# Patient Record
Sex: Female | Born: 1980 | Race: White | Hispanic: No | Marital: Married | Smoking: Never smoker
Health system: Southern US, Community
[De-identification: ages and names within clinical notes are randomized; demographics above are authoritative.]

## PROBLEM LIST (undated history)

## (undated) DIAGNOSIS — F419 Anxiety disorder, unspecified: Secondary | ICD-10-CM

## (undated) DIAGNOSIS — F319 Bipolar disorder, unspecified: Secondary | ICD-10-CM

## (undated) DIAGNOSIS — N289 Disorder of kidney and ureter, unspecified: Secondary | ICD-10-CM

## (undated) DIAGNOSIS — R112 Nausea with vomiting, unspecified: Secondary | ICD-10-CM

## (undated) DIAGNOSIS — Q625 Duplication of ureter: Secondary | ICD-10-CM

## (undated) DIAGNOSIS — F32A Depression, unspecified: Secondary | ICD-10-CM

## (undated) DIAGNOSIS — N390 Urinary tract infection, site not specified: Secondary | ICD-10-CM

## (undated) DIAGNOSIS — F329 Major depressive disorder, single episode, unspecified: Secondary | ICD-10-CM

## (undated) DIAGNOSIS — T8859XA Other complications of anesthesia, initial encounter: Secondary | ICD-10-CM

## (undated) DIAGNOSIS — T4145XA Adverse effect of unspecified anesthetic, initial encounter: Secondary | ICD-10-CM

## (undated) DIAGNOSIS — Z9889 Other specified postprocedural states: Secondary | ICD-10-CM

## (undated) HISTORY — PX: URETER REMOVAL: SHX821

## (undated) HISTORY — PX: APPENDECTOMY: SHX54

## (undated) HISTORY — DX: Duplication of ureter: Q62.5

## (undated) HISTORY — PX: HERNIA REPAIR: SHX51

---

## 2010-05-22 HISTORY — PX: TUBAL LIGATION: SHX77

## 2012-10-20 ENCOUNTER — Emergency Department (HOSPITAL_COMMUNITY)
Admission: EM | Admit: 2012-10-20 | Discharge: 2012-10-20 | Disposition: A | Discharge status: Home / Self Care | Payer: Medicaid Other | Attending: Emergency Medicine | Admitting: Emergency Medicine

## 2012-10-20 ENCOUNTER — Encounter (HOSPITAL_COMMUNITY): Payer: Self-pay | Admitting: Emergency Medicine

## 2012-10-20 DIAGNOSIS — H6691 Otitis media, unspecified, right ear: Secondary | ICD-10-CM

## 2012-10-20 DIAGNOSIS — Z87448 Personal history of other diseases of urinary system: Secondary | ICD-10-CM | POA: Insufficient documentation

## 2012-10-20 DIAGNOSIS — Z91040 Latex allergy status: Secondary | ICD-10-CM | POA: Insufficient documentation

## 2012-10-20 DIAGNOSIS — H6690 Otitis media, unspecified, unspecified ear: Secondary | ICD-10-CM | POA: Insufficient documentation

## 2012-10-20 DIAGNOSIS — Z87440 Personal history of urinary (tract) infections: Secondary | ICD-10-CM | POA: Insufficient documentation

## 2012-10-20 DIAGNOSIS — J3489 Other specified disorders of nose and nasal sinuses: Secondary | ICD-10-CM | POA: Insufficient documentation

## 2012-10-20 DIAGNOSIS — H9209 Otalgia, unspecified ear: Secondary | ICD-10-CM | POA: Insufficient documentation

## 2012-10-20 DIAGNOSIS — H6120 Impacted cerumen, unspecified ear: Secondary | ICD-10-CM | POA: Insufficient documentation

## 2012-10-20 DIAGNOSIS — J029 Acute pharyngitis, unspecified: Secondary | ICD-10-CM

## 2012-10-20 HISTORY — DX: Disorder of kidney and ureter, unspecified: N28.9

## 2012-10-20 HISTORY — DX: Urinary tract infection, site not specified: N39.0

## 2012-10-20 MED ORDER — IBUPROFEN 600 MG PO TABS: 600.0000 mg | ORAL_TABLET | Freq: Four times a day (QID) | ORAL | Status: DC | PRN

## 2012-10-20 MED ORDER — NEOMYCIN-POLYMYXIN-HC 3.5-10000-1 OT SOLN
4.0000 [drp] | Freq: Once | OTIC | Status: AC
  Administered 2012-10-20: 4 [drp] via OTIC
  Filled 2012-10-20: qty 10

## 2012-10-20 MED ORDER — AMOXICILLIN 250 MG PO CAPS: 250.0000 mg | ORAL_CAPSULE | Freq: Three times a day (TID) | ORAL | Status: DC

## 2012-10-20 NOTE — ED Provider Notes (Signed)
History     CSN: [id number]  Arrival date & time 10/20/12  1708   First MD Initiated Contact with Patient 10/20/12 1718      Chief Complaint  Patient presents with  . Otalgia  . Sore Throat    (Consider location/radiation/quality/duration/timing/severity/associated sxs/prior treatment) Patient is a 32 y.o. female presenting with ear pain and pharyngitis. The history is provided by the patient.  Otalgia Location:  Right Severity:  Moderate Onset quality:  Gradual Duration:  1 day Timing:  Constant Progression:  Worsening Chronicity:  New Relieved by:  Nothing Associated symptoms: congestion and sore throat   Associated symptoms: no abdominal pain, no diarrhea, no fever, no headaches, no rash and no vomiting   Sore Throat Associated symptoms include congestion and a sore throat. Pertinent negatives include no abdominal pain, chills, fever, headaches, nausea, rash or vomiting.   Diane [name] is a 32 y.o. female who presents to the ED with ear pain x 24 hours. The pain is in the right ear. Associated symptoms include sore throat, congestion and ears feeling stopped up as well as the pain in the right.   Past Medical History  Diagnosis Date  . Renal disorder   . UTI (lower urinary tract infection)     No past surgical history on file.  No family history on file.  History  Substance Use Topics  . Smoking status: Not on file  . Smokeless tobacco: Not on file  . Alcohol Use: Not on file    OB History   Grav Para Term Preterm Abortions TAB SAB Ect Mult Living                  Review of Systems  Constitutional: Negative for fever, chills and appetite change.  HENT: Positive for ear pain, congestion and sore throat. Negative for trouble swallowing.   Gastrointestinal: Negative for nausea, vomiting, abdominal pain, diarrhea and constipation.  Genitourinary: Negative for dysuria and frequency.  Musculoskeletal: Negative for back pain.  Skin: Negative for rash.   Neurological: Negative for light-headedness and headaches.  Psychiatric/Behavioral: The patient is not nervous/anxious.     Allergies  Latex  Home Medications  No current outpatient prescriptions on file.  BP 115/90  Pulse 72  Temp(Src) 98 F (36.7 C)  Resp 16  Ht 5\' 3"  (1.6 m)  Wt 145 lb (65.772 kg)  BMI 25.69 kg/m2  SpO2 100%  LMP 09/19/2012  Physical Exam  Nursing note and vitals reviewed. Constitutional: She is oriented to person, place, and time. She appears well-developed and well-nourished. No distress.  HENT:  Head: Normocephalic and atraumatic.  Mouth/Throat: Uvula is midline and mucous membranes are normal. Posterior oropharyngeal erythema present.  Both ears occluded with cerumen. After irrigation the TM's can be visualized. The left is normal. The right exhibits erythema, the TM is retracted and erythematous.   Eyes: Conjunctivae and EOM are normal.  Neck: Normal range of motion. Neck supple.  Cardiovascular: Normal rate and regular rhythm.   Pulmonary/Chest: Effort normal and breath sounds normal.  Abdominal: Soft. There is no tenderness. There is no CVA tenderness.  Musculoskeletal: Normal range of motion.  Neurological: She is alert and oriented to person, place, and time. No cranial nerve deficit.  Skin: Skin is warm and dry.  Psychiatric: She has a normal mood and affect. Her behavior is normal. Judgment and thought content normal.   ED Course  Procedures (including critical care time) I irrigated the patient's ears with warm water using a syringe,  good results with cerumen removed.  Cortisporin Otic. Susp. To ears.   MDM  32 y.o. female with bilateral cerumen impaction. After cerumen removed, TM's visualized, otitis media right, pharyngitis. Discussed with the patient clinical findings and plan of care. All questioned fully answered. She will return if she has problems. She recently moved to [place] from [place]. I gave her resource guide to try and  find PCP. Will start patient on Amoxil and ibuprofen. She will contact a PCP for follow up.    Medication List    TAKE these medications       amoxicillin 250 MG capsule  Commonly known as:  AMOXIL  Take 1 capsule (250 mg total) by mouth 3 (three) times daily.     ibuprofen 600 MG tablet  Commonly known as:  ADVIL,MOTRIN  Take 1 tablet (600 mg total) by mouth every 6 (six) hours as needed for pain.             [facility] Orlene Och, [place] 10/20/12 1753

## 2012-10-20 NOTE — ED Notes (Signed)
States that she woke up with right ear pain and sore throat yesterday.

## 2012-10-21 NOTE — ED Provider Notes (Signed)
Medical screening examination/treatment/procedure(s) were performed by non-physician practitioner and as supervising physician I was immediately available for consultation/collaboration. Larin Weissberg, MD, FACEP   Tyasia Packard L Prerana Strayer, MD 10/21/12 0029 

## 2013-08-04 ENCOUNTER — Encounter (HOSPITAL_COMMUNITY): Payer: Self-pay | Admitting: Emergency Medicine

## 2013-08-04 ENCOUNTER — Emergency Department (HOSPITAL_COMMUNITY)
Admission: EM | Admit: 2013-08-04 | Discharge: 2013-08-04 | Disposition: A | Discharge status: Home / Self Care | Payer: Medicaid Other | Attending: Emergency Medicine | Admitting: Emergency Medicine

## 2013-08-04 DIAGNOSIS — Y939 Activity, unspecified: Secondary | ICD-10-CM | POA: Insufficient documentation

## 2013-08-04 DIAGNOSIS — Z79899 Other long term (current) drug therapy: Secondary | ICD-10-CM | POA: Insufficient documentation

## 2013-08-04 DIAGNOSIS — Z91040 Latex allergy status: Secondary | ICD-10-CM | POA: Insufficient documentation

## 2013-08-04 DIAGNOSIS — S335XXA Sprain of ligaments of lumbar spine, initial encounter: Secondary | ICD-10-CM | POA: Insufficient documentation

## 2013-08-04 DIAGNOSIS — R52 Pain, unspecified: Secondary | ICD-10-CM | POA: Insufficient documentation

## 2013-08-04 DIAGNOSIS — X58XXXA Exposure to other specified factors, initial encounter: Secondary | ICD-10-CM | POA: Insufficient documentation

## 2013-08-04 DIAGNOSIS — Y929 Unspecified place or not applicable: Secondary | ICD-10-CM | POA: Insufficient documentation

## 2013-08-04 DIAGNOSIS — G8929 Other chronic pain: Secondary | ICD-10-CM | POA: Insufficient documentation

## 2013-08-04 DIAGNOSIS — T148XXA Other injury of unspecified body region, initial encounter: Secondary | ICD-10-CM

## 2013-08-04 DIAGNOSIS — Z87440 Personal history of urinary (tract) infections: Secondary | ICD-10-CM | POA: Insufficient documentation

## 2013-08-04 DIAGNOSIS — Z87448 Personal history of other diseases of urinary system: Secondary | ICD-10-CM | POA: Insufficient documentation

## 2013-08-04 MED ORDER — METHOCARBAMOL 500 MG PO TABS: 500.0000 mg | ORAL_TABLET | Freq: Three times a day (TID) | ORAL | Status: DC

## 2013-08-04 MED ORDER — DEXAMETHASONE 4 MG PO TABS: ORAL_TABLET | ORAL | Status: DC

## 2013-08-04 NOTE — ED Provider Notes (Signed)
CSN: [id number]     Arrival date & time 08/04/13  1247 History   First MD Initiated Contact with Patient 08/04/13 1405     Chief Complaint  Patient presents with  . Back Pain     (Consider location/radiation/quality/duration/timing/severity/associated sxs/prior Treatment) Patient is a 33 y.o. female presenting with back pain.  Back Pain Location:  Lumbar spine Quality:  Aching Pain severity:  Moderate Pain is:  Same all the time Onset quality:  Gradual Duration:  6 months Timing:  Intermittent Progression:  Worsening Chronicity:  Chronic Context: twisting   Context: not falling   Relieved by:  Nothing Worsened by:  Movement Ineffective treatments:  None tried Associated symptoms: no abdominal pain, no bladder incontinence, no bowel incontinence, no chest pain, no dysuria, no numbness and no perianal numbness     Past Medical History  Diagnosis Date  . Renal disorder   . UTI (lower urinary tract infection)    Past Surgical History  Procedure Laterality Date  . Tubal ligation  2012  . Ureter removal     Family History  Problem Relation Age of Onset  . Diabetes Mother   . Stroke Father   . Heart failure Father   . Diabetes Father   . Epilepsy Brother    History  Substance Use Topics  . Smoking status: Never Smoker   . Smokeless tobacco: Never Used  . Alcohol Use: No   OB History   Grav Para Term Preterm Abortions TAB SAB Ect Mult Living   6 6 1 5      6      Review of Systems  Constitutional: Negative for activity change.       All ROS Neg except as noted in HPI  HENT: Negative for nosebleeds.   Eyes: Negative for photophobia and discharge.  Respiratory: Negative for cough, shortness of breath and wheezing.   Cardiovascular: Negative for chest pain and palpitations.  Gastrointestinal: Negative for abdominal pain, blood in stool and bowel incontinence.  Genitourinary: Negative for bladder incontinence, dysuria, frequency and hematuria.  Musculoskeletal:  Positive for back pain. Negative for arthralgias and neck pain.  Skin: Negative.   Neurological: Negative for dizziness, seizures, speech difficulty and numbness.  Psychiatric/Behavioral: Negative for hallucinations and confusion.      Allergies  Latex  Home Medications   Current Outpatient Rx  Name  Route  Sig  Dispense  Refill  . ARIPiprazole (ABILIFY) 5 MG tablet   Oral   Take 5 mg by mouth 2 (two) times daily.         Marland Kitchen FLUoxetine (PROZAC) 20 MG tablet   Oral   Take 20 mg by mouth 3 (three) times daily.         . methocarbamol (ROBAXIN) 500 MG tablet   Oral   Take 500 mg by mouth daily as needed (pain).          BP 130/78  Pulse 79  Temp(Src) 97.9 F (36.6 C) (Oral)  Resp 18  Ht 5\' 3"  (1.6 m)  Wt 145 lb (65.772 kg)  BMI 25.69 kg/m2  SpO2 100%  LMP 07/14/2013 Physical Exam  Nursing note and vitals reviewed. Constitutional: She is oriented to person, place, and time. She appears well-developed and well-nourished.  Non-toxic appearance.  HENT:  Head: Normocephalic.  Right Ear: Tympanic membrane and external ear normal.  Left Ear: Tympanic membrane and external ear normal.  Eyes: EOM and lids are normal. Pupils are equal, round, and reactive to light.  Neck:  Normal range of motion. Neck supple. Carotid bruit is not present.  Cardiovascular: Normal rate, regular rhythm, normal heart sounds, intact distal pulses and normal pulses.   Pulmonary/Chest: Breath sounds normal. No respiratory distress.  Abdominal: Soft. Bowel sounds are normal. There is no tenderness. There is no guarding.  Musculoskeletal: Normal range of motion.       Thoracic back: She exhibits pain and spasm.       Lumbar back: She exhibits pain and spasm.       Back:  Lymphadenopathy:       Head (right side): No submandibular adenopathy present.       Head (left side): No submandibular adenopathy present.    She has no cervical adenopathy.  Neurological: She is alert and oriented to  person, place, and time. She has normal strength. No cranial nerve deficit or sensory deficit. Gait normal.  Skin: Skin is warm and dry.  Psychiatric: She has a normal mood and affect. Her speech is normal.    ED Course  Procedures (including critical care time) Labs Review Labs Reviewed - No data to display Imaging Review No results found.   EKG Interpretation None      MDM Pt has an acute exacerbation of chronic back pain and muscle strain. No gross neuro deficits. No step of noted. No fever or dysuria reported. Plan - Rx for decadron and robaxin. Pt to rest back as much as possible. Pt to follow up with orthopedics.   Final diagnoses:  Muscle strain    **I have reviewed nursing notes, vital signs, and all appropriate lab and imaging results for this patient.Kathie Dike*    Chanice Brenton M Adamariz Gillott, PA-C 08/06/13 1128

## 2013-08-04 NOTE — ED Notes (Signed)
Pain  c spine and t spine area for months, Worse in last 3 weeks. No known injury.  Has been taking robaxin and naprosyn without relief.

## 2013-08-04 NOTE — Discharge Instructions (Signed)
Please apply heat to your back. Please use Decadron 2 times daily with food. Please use Robaxin 3 times daily. Please use Tylenol extra strength every 4 hours. Robaxin may cause drowsiness, please use with caution. See your physician and being a dull pediatric medicine clinic for followup and management. Muscle Strain A muscle strain (pulled muscle) happens when a muscle is stretched beyond normal length. It happens when a sudden, violent force stretches your muscle too far. Usually, a few of the fibers in your muscle are torn. Muscle strain is common in athletes. Recovery usually takes 1 2 weeks. Complete healing takes 5 6 weeks.  HOME CARE   Follow the PRICE method of treatment to help your injury get better. Do this the first 2 3 days after the injury:  Protect. Protect the muscle to keep it from getting injured again.  Rest. Limit your activity and rest the injured body part.  Ice. Put ice in a plastic bag. Place a towel between your skin and the bag. Then, apply the ice and leave it on from 15 20 minutes each hour. After the third day, switch to moist heat packs.  Compression. Use a splint or elastic bandage on the injured area for comfort. Do not put it on too tightly.  Elevate. Keep the injured body part above the level of your heart.  Only take medicine as told by your doctor.  Warm up before doing exercise to prevent future muscle strains. GET HELP IF:   You have more pain or puffiness (swelling) in the injured area.  You feel numbness, tingling, or notice a loss of strength in the injured area. MAKE SURE YOU:   Understand these instructions.  Will watch your condition.  Will get help right away if you are not doing well or get worse. Document Released: 02/15/2008 Document Revised: 02/26/2013 Document Reviewed: 12/05/2012 [facility] Patient Information 2014 [employer] [place].

## 2013-08-04 NOTE — ED Notes (Signed)
Pt reports intermittent back pain x 6 months.

## 2013-08-07 NOTE — ED Provider Notes (Signed)
Medical screening examination/treatment/procedure(s) were performed by non-physician practitioner and as supervising physician I was immediately available for consultation/collaboration.   EKG Interpretation None        Joya Gaskinsonald W Breslin Burklow, MD 08/07/13 1511

## 2013-09-09 ENCOUNTER — Ambulatory Visit (HOSPITAL_COMMUNITY)
Admission: RE | Admit: 2013-09-09 | Discharge: 2013-09-09 | Disposition: A | Discharge status: Home / Self Care | Payer: Medicaid Other | Source: Ambulatory Visit | Attending: Family Medicine | Admitting: Family Medicine

## 2013-09-09 DIAGNOSIS — R293 Abnormal posture: Secondary | ICD-10-CM | POA: Insufficient documentation

## 2013-09-09 NOTE — Evaluation (Signed)
Physical Therapy Evaluation  Patient Details  Name: Diane [name] MRN: [id number] Date of Birth: 1980/07/11  Today's Date: 09/09/2013 Time: 1515-1600 PT Time Calculation (min): 45 min Charge:  Evaluation              Visit#: 1 of 1  Assessment Diagnosis: thoracic back pain Prior Therapy: none Authorization: medicaid     Past Medical History:  Past Medical History  Diagnosis Date  . Renal disorder   . UTI (lower urinary tract infection)    Past Surgical History:  Past Surgical History  Procedure Laterality Date  . Tubal ligation  2012  . 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  She has been having low back pain for approximately three months.  [facility].  Pt states her pain is in the center of her shoulder blades.   How long can you sit comfortably?: sometimes it is aggrevated other times it isn't. How long can you stand comfortably?: no real change;   Able to stand for up to an hour  How long can you walk comfortably?: able to walk as long as she wants to  but sometimes it increases her pain. Pain Assessment Currently in Pain?: Yes Pain Score: 5  Pain Location: Back Pain Orientation: Mid Pain Type: Chronic pain    Balance Screening  no falls   Prior Functioning   I works at [employer]    Assessment Palpation Palpation: T 7 and 8 were slightly rotatedto the LT ; no mm spasms palpatable.  Exercise/Treatments    Stretches Chest Stretch: 3 reps Lower Cervical/Upper Thoracic Stretch: 3 reps   Theraband Exercises Scapula Retraction: 10 reps;Blue Shoulder Extension: 10 reps;Blue Rows: 10 reps;Blue  Supine Exercises Other Supine Exercise: pelvic tilt x 10    Prone Exercises w-back x 10 Shoulder extension x 10 Single arm raise x 10 Rows x 10  Quadriped position:  Mad cat x 5      Physical Therapy Assessment and Plan PT  Assessment and Plan Clinical Impression Statement: Pt is a 33 yo female who has been having progressive thoracic pain over the past three months. Pt exam demonstrated postural imbalances , decreased scapular stabilzation.  Pt will benefit form instruction on strengthening as well as body mechanics.  Pt insurance only allows one evaluation per year.   Pt will benefit from skilled therapeutic intervention in order to improve on the following deficits: Decreased activity tolerance;Decreased strength;Pain;Improper spinal/pelvic alignment Rehab Potential: Fair PT Frequency: Min 1X/week PT Duration:  (1 time only secondary to insurance.) HEP: given for shoulder stability, posture as well as proper body mechanics.       Goals Home Exercise Program Pt/caregiver will Perform Home Exercise Program: For increased strengthening PT Goal: Perform Home Exercise Program - Progress: Goal set today Plan:  One time visit for HEP  Problem List Patient Active Problem List   Diagnosis Date Noted  . Poor posture 09/09/2013       GP    Bella Kennedyynthia J Leelan Rajewski 09/09/2013, 4:36 PM  Physician Documentation Your signature is required to indicate approval of the treatment plan as stated above.  Please sign and either send electronically or make a copy of this report for your files and return this physician signed original.   Please mark one 1.__approve of plan  2. ___approve of plan with the following conditions.   ______________________________  _____________________ Physician Signature                                                                                                             Date

## 2014-02-01 ENCOUNTER — Encounter (HOSPITAL_COMMUNITY): Payer: Self-pay | Admitting: Emergency Medicine

## 2014-02-01 ENCOUNTER — Emergency Department (HOSPITAL_COMMUNITY)
Admission: EM | Admit: 2014-02-01 | Discharge: 2014-02-01 | Disposition: A | Discharge status: Home / Self Care | Payer: Medicaid Other | Attending: Emergency Medicine | Admitting: Emergency Medicine

## 2014-02-01 DIAGNOSIS — R42 Dizziness and giddiness: Secondary | ICD-10-CM | POA: Insufficient documentation

## 2014-02-01 DIAGNOSIS — Z3202 Encounter for pregnancy test, result negative: Secondary | ICD-10-CM | POA: Diagnosis not present

## 2014-02-01 DIAGNOSIS — Z79899 Other long term (current) drug therapy: Secondary | ICD-10-CM | POA: Insufficient documentation

## 2014-02-01 DIAGNOSIS — Z9089 Acquired absence of other organs: Secondary | ICD-10-CM | POA: Insufficient documentation

## 2014-02-01 DIAGNOSIS — Z87448 Personal history of other diseases of urinary system: Secondary | ICD-10-CM | POA: Insufficient documentation

## 2014-02-01 DIAGNOSIS — Z9851 Tubal ligation status: Secondary | ICD-10-CM | POA: Insufficient documentation

## 2014-02-01 DIAGNOSIS — Z91040 Latex allergy status: Secondary | ICD-10-CM | POA: Diagnosis not present

## 2014-02-01 DIAGNOSIS — IMO0002 Reserved for concepts with insufficient information to code with codable children: Secondary | ICD-10-CM | POA: Diagnosis not present

## 2014-02-01 DIAGNOSIS — N390 Urinary tract infection, site not specified: Secondary | ICD-10-CM | POA: Insufficient documentation

## 2014-02-01 LAB — BASIC METABOLIC PANEL
Anion gap: 12 (ref 5–15)
BUN: 10 mg/dL (ref 6–23)
CALCIUM: 9.2 mg/dL (ref 8.4–10.5)
CO2: 26 mEq/L (ref 19–32)
Chloride: 100 mEq/L (ref 96–112)
Creatinine, Ser: 1.02 mg/dL (ref 0.50–1.10)
GFR, EST AFRICAN AMERICAN: 83 mL/min — AB (ref >90)
GFR, EST NON AFRICAN AMERICAN: 72 mL/min — AB (ref >90)
GLUCOSE: 123 mg/dL — AB (ref 70–99)
Potassium: 3.6 mEq/L — ABNORMAL LOW (ref 3.7–5.3)
SODIUM: 138 meq/L (ref 137–147)

## 2014-02-01 LAB — URINALYSIS, ROUTINE W REFLEX MICROSCOPIC
BILIRUBIN URINE: NEGATIVE
Glucose, UA: NEGATIVE mg/dL
KETONES UR: NEGATIVE mg/dL
NITRITE: NEGATIVE
PROTEIN: NEGATIVE mg/dL
SPECIFIC GRAVITY, URINE: 1.01 (ref 1.005–1.030)
UROBILINOGEN UA: 0.2 mg/dL (ref 0.0–1.0)
pH: 6 (ref 5.0–8.0)

## 2014-02-01 LAB — CBC WITH DIFFERENTIAL/PLATELET
BASOS PCT: 0 % (ref 0–1)
Basophils Absolute: 0 10*3/uL (ref 0.0–0.1)
EOS ABS: 0.5 10*3/uL (ref 0.0–0.7)
EOS PCT: 7 % — AB (ref 0–5)
HCT: 39.5 % (ref 36.0–46.0)
Hemoglobin: 13.5 g/dL (ref 12.0–15.0)
LYMPHS ABS: 1.2 10*3/uL (ref 0.7–4.0)
Lymphocytes Relative: 18 % (ref 12–46)
MCH: 29.5 pg (ref 26.0–34.0)
MCHC: 34.2 g/dL (ref 30.0–36.0)
MCV: 86.2 fL (ref 78.0–100.0)
MONOS PCT: 4 % (ref 3–12)
Monocytes Absolute: 0.3 10*3/uL (ref 0.1–1.0)
NEUTROS PCT: 71 % (ref 43–77)
Neutro Abs: 4.8 10*3/uL (ref 1.7–7.7)
PLATELETS: 228 10*3/uL (ref 150–400)
RBC: 4.58 MIL/uL (ref 3.87–5.11)
RDW: 12.9 % (ref 11.5–15.5)
WBC: 6.8 10*3/uL (ref 4.0–10.5)

## 2014-02-01 LAB — URINE MICROSCOPIC-ADD ON

## 2014-02-01 LAB — PREGNANCY, URINE: PREG TEST UR: NEGATIVE

## 2014-02-01 MED ORDER — CEPHALEXIN 500 MG PO CAPS
500.0000 mg | ORAL_CAPSULE | Freq: Four times a day (QID) | ORAL | Status: DC
Start: 2014-02-01 — End: 2015-05-29

## 2014-02-01 MED ORDER — LIDOCAINE HCL (PF) 2 % IJ SOLN
INTRAMUSCULAR | Status: AC
  Administered 2014-02-01: 14:00:00
  Filled 2014-02-01: qty 10

## 2014-02-01 MED ORDER — CEFTRIAXONE SODIUM 1 G IJ SOLR
1.0000 g | Freq: Once | INTRAMUSCULAR | Status: AC
  Administered 2014-02-01: 1 g via INTRAMUSCULAR
  Filled 2014-02-01: qty 10

## 2014-02-01 NOTE — Discharge Instructions (Signed)

## 2014-02-01 NOTE — ED Notes (Signed)
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

## 2014-02-01 NOTE — ED Provider Notes (Signed)
CSN: [id number]     Arrival date & time 02/01/14  1240 History   This chart was scribed for Gilda Crease, MD, by Yevette Edwards, ED Scribe. This patient was seen in room APA12/AP[facility]12 and the patient's care was started at 1:19 PM.  First MD Initiated Contact with Patient 02/01/14 1312     Chief Complaint  Patient presents with  . Dizziness    The history is provided by the patient. No language interpreter was used.   HPI Comments: Diane [name] is a 33 y.o. female, with a h/o a renal disorder, who presents to the Emergency Department complaining of dizziness which began yesterday morning upon standing. She also endorses dysuria and frequency. She denies SOB or lower extremity swelling.  Diane [name] voices a h/o UTI. She is a non-smoker.   Past Medical History  Diagnosis Date  . Renal disorder   . UTI (lower urinary tract infection)    Past Surgical History  Procedure Laterality Date  . Tubal ligation  2012  . Ureter removal     Family History  Problem Relation Age of Onset  . Diabetes Mother   . Stroke Father   . Heart failure Father   . Diabetes Father   . Epilepsy Brother    History  Substance Use Topics  . Smoking status: Never Smoker   . Smokeless tobacco: Never Used  . Alcohol Use: No   OB History   Grav Para Term Preterm Abortions TAB SAB Ect Mult Living   Review of Systems  Respiratory: Negative for shortness of breath.   Cardiovascular: Negative for leg swelling.  Genitourinary: Positive for dysuria and frequency.  Neurological: Positive for dizziness.    Allergies  Latex  Home Medications   Prior to Admission medications   Medication Sig Start Date End Date Taking? Authorizing Provider  ARIPiprazole (ABILIFY) 5 MG tablet Take 5 mg by mouth 2 (two) times daily.    Historical Provider, MD  dexamethasone (DECADRON) 4 MG tablet 1 po bid with food 08/04/13   Kathie Dike, PA-C  FLUoxetine (PROZAC) 20 MG tablet Take 20  mg by mouth 3 (three) times daily.    Historical Provider, MD  methocarbamol (ROBAXIN) 500 MG tablet Take 500 mg by mouth daily as needed (pain).    Historical Provider, MD  methocarbamol (ROBAXIN) 500 MG tablet Take 1 tablet (500 mg total) by mouth 3 (three) times daily. 08/04/13   Kathie Dike, PA-C   Triage Vitals: BP 130/94  Pulse 72  Temp(Src) 99.2 F (37.3 C) (Oral)  Resp 16  Ht  (1.6 m)  Wt 141 lb (63.957 kg)  BMI 24.98 kg/m2  SpO2 100%  LMP 01/01/2014  Physical Exam  Constitutional: She is oriented to person, place, and time. She appears well-developed and well-nourished. No distress.  HENT:  Head: Normocephalic and atraumatic.  Right Ear: Hearing normal.  Left Ear: Hearing normal.  Nose: Nose normal.  Mouth/Throat: Oropharynx is clear and moist and mucous membranes are normal.  Eyes: Conjunctivae and EOM are normal. Pupils are equal, round, and reactive to light.  Neck: Normal range of motion. Neck supple.  Cardiovascular: Regular rhythm, S1 normal and S2 normal.  Exam reveals no gallop and no friction rub.   No murmur heard. Pulmonary/Chest: Effort normal and breath sounds normal. No respiratory distress. She exhibits no tenderness.  Abdominal: Soft. Normal appearance and bowel sounds are normal.  There is no hepatosplenomegaly. There is no tenderness. There is no rebound, no guarding, no tenderness at McBurney's point and negative Murphy's sign. No hernia.  Musculoskeletal: Normal range of motion.  Neurological: She is alert and oriented to person, place, and time. She has normal strength. No cranial nerve deficit or sensory deficit. Coordination normal. GCS eye subscore is 4. GCS verbal subscore is 5. GCS motor subscore is 6.  Skin: Skin is warm, dry and intact. No rash noted. No cyanosis.  Psychiatric: She has a normal mood and affect. Her speech is normal and behavior is normal. Thought content normal.    ED Course  Procedures (including critical care  time)  DIAGNOSTIC STUDIES: Oxygen Saturation is 100% on room air, normal by my interpretation.    COORDINATION OF CARE:  1:25 PM- Discussed treatment plan with patient, and the patient agreed to the plan.   Labs Review Labs Reviewed  PREGNANCY, URINE  URINALYSIS, ROUTINE W REFLEX MICROSCOPIC  CBC WITH DIFFERENTIAL  BASIC METABOLIC PANEL    Imaging Review No results found.   EKG Interpretation None      MDM   Final diagnoses:  None   UTI  Patient presents to the ER for evaluation of urinary frequency, dysuria. Symptoms began yesterday. She has had slight nausea, no vomiting. Vital signs are stable, no systemic symptoms. Patient appears well. Urinalysis does reveal evidence of infection. Renal function checked. Patient*here in the ER, will continue Keflex as an outpatient.  I personally performed the services described in this documentation, which was scribed in my presence. The recorded information has been reviewed and is accurate.     Gilda Crease, MD 02/01/14 1344

## 2014-03-23 ENCOUNTER — Encounter (HOSPITAL_COMMUNITY): Payer: Self-pay | Admitting: Emergency Medicine

## 2014-03-31 ENCOUNTER — Ambulatory Visit: Payer: Medicaid Other | Admitting: Urology

## 2014-06-23 ENCOUNTER — Ambulatory Visit (INDEPENDENT_AMBULATORY_CARE_PROVIDER_SITE_OTHER): Payer: Medicaid Other | Admitting: Urology

## 2014-06-23 DIAGNOSIS — N3020 Other chronic cystitis without hematuria: Secondary | ICD-10-CM

## 2014-06-23 DIAGNOSIS — R339 Retention of urine, unspecified: Secondary | ICD-10-CM

## 2014-09-01 ENCOUNTER — Ambulatory Visit: Payer: Medicaid Other | Admitting: Urology

## 2015-05-29 ENCOUNTER — Emergency Department (HOSPITAL_COMMUNITY): Payer: Medicaid Other

## 2015-05-29 ENCOUNTER — Encounter (HOSPITAL_COMMUNITY): Payer: Self-pay | Admitting: Emergency Medicine

## 2015-05-29 ENCOUNTER — Observation Stay (HOSPITAL_COMMUNITY)
Admission: EM | Admit: 2015-05-29 | Discharge: 2015-05-30 | Disposition: A | Discharge status: Home / Self Care | Payer: Medicaid Other | Attending: General Surgery | Admitting: General Surgery

## 2015-05-29 ENCOUNTER — Emergency Department (HOSPITAL_COMMUNITY): Payer: Medicaid Other | Admitting: Anesthesiology

## 2015-05-29 ENCOUNTER — Encounter (HOSPITAL_COMMUNITY)
Admission: EM | Disposition: A | Discharge status: Home / Self Care | Payer: Self-pay | Source: Home / Self Care | Attending: Emergency Medicine

## 2015-05-29 DIAGNOSIS — K3580 Unspecified acute appendicitis: Secondary | ICD-10-CM | POA: Diagnosis not present

## 2015-05-29 HISTORY — PX: LAPAROSCOPIC APPENDECTOMY: SHX408

## 2015-05-29 LAB — CBC WITH DIFFERENTIAL/PLATELET
Basophils Absolute: 0 10*3/uL (ref 0.0–0.1)
Basophils Relative: 0 %
Eosinophils Absolute: 0.1 10*3/uL (ref 0.0–0.7)
Eosinophils Relative: 1 %
HEMATOCRIT: 37.8 % (ref 36.0–46.0)
HEMOGLOBIN: 12.9 g/dL (ref 12.0–15.0)
LYMPHS ABS: 1.1 10*3/uL (ref 0.7–4.0)
LYMPHS PCT: 10 %
MCH: 30.2 pg (ref 26.0–34.0)
MCHC: 34.1 g/dL (ref 30.0–36.0)
MCV: 88.5 fL (ref 78.0–100.0)
Monocytes Absolute: 0.5 10*3/uL (ref 0.1–1.0)
Monocytes Relative: 5 %
NEUTROS PCT: 84 %
Neutro Abs: 9.4 10*3/uL — ABNORMAL HIGH (ref 1.7–7.7)
Platelets: 248 10*3/uL (ref 150–400)
RBC: 4.27 MIL/uL (ref 3.87–5.11)
RDW: 12.5 % (ref 11.5–15.5)
WBC: 11.1 10*3/uL — AB (ref 4.0–10.5)

## 2015-05-29 LAB — COMPREHENSIVE METABOLIC PANEL
ALK PHOS: 43 U/L (ref 38–126)
ALT: 8 U/L — ABNORMAL LOW (ref 14–54)
ANION GAP: 8 (ref 5–15)
AST: 13 U/L — ABNORMAL LOW (ref 15–41)
Albumin: 4.2 g/dL (ref 3.5–5.0)
BUN: 13 mg/dL (ref 6–20)
CO2: 24 mmol/L (ref 22–32)
Calcium: 8.8 mg/dL — ABNORMAL LOW (ref 8.9–10.3)
Chloride: 106 mmol/L (ref 101–111)
Creatinine, Ser: 1.02 mg/dL — ABNORMAL HIGH (ref 0.44–1.00)
GFR calc non Af Amer: >60 mL/min (ref >60)
Glucose, Bld: 109 mg/dL — ABNORMAL HIGH (ref 65–99)
POTASSIUM: 3.9 mmol/L (ref 3.5–5.1)
SODIUM: 138 mmol/L (ref 135–145)
Total Bilirubin: 0.5 mg/dL (ref 0.3–1.2)
Total Protein: 7.2 g/dL (ref 6.5–8.1)

## 2015-05-29 LAB — URINE MICROSCOPIC-ADD ON

## 2015-05-29 LAB — URINALYSIS, ROUTINE W REFLEX MICROSCOPIC
Bilirubin Urine: NEGATIVE
Glucose, UA: NEGATIVE mg/dL
Ketones, ur: NEGATIVE mg/dL
NITRITE: NEGATIVE
Protein, ur: NEGATIVE mg/dL
pH: 6 (ref 5.0–8.0)

## 2015-05-29 LAB — I-STAT BETA HCG BLOOD, ED (MC, WL, AP ONLY): I-stat hCG, quantitative: <5 m[IU]/mL (ref <5)

## 2015-05-29 LAB — LIPASE, BLOOD: Lipase: 33 U/L (ref 11–51)

## 2015-05-29 SURGERY — APPENDECTOMY, LAPAROSCOPIC
Anesthesia: General | Site: Abdomen

## 2015-05-29 MED ORDER — KETOROLAC TROMETHAMINE 30 MG/ML IJ SOLN
30.0000 mg | Freq: Once | INTRAMUSCULAR | Status: AC
  Administered 2015-05-29: 30 mg via INTRAVENOUS
  Filled 2015-05-29: qty 1

## 2015-05-29 MED ORDER — ARTIFICIAL TEARS OP OINT
TOPICAL_OINTMENT | OPHTHALMIC | Status: DC | PRN
  Administered 2015-05-29: 1 via OPHTHALMIC

## 2015-05-29 MED ORDER — PROPOFOL 10 MG/ML IV BOLUS
INTRAVENOUS | Status: AC
  Filled 2015-05-29: qty 40

## 2015-05-29 MED ORDER — LACTATED RINGERS IV SOLN
INTRAVENOUS | Status: DC
  Administered 2015-05-29 – 2015-05-30 (×2): via INTRAVENOUS

## 2015-05-29 MED ORDER — ACETAMINOPHEN 650 MG RE SUPP: 650.0000 mg | Freq: Four times a day (QID) | RECTAL | Status: DC | PRN

## 2015-05-29 MED ORDER — LACTATED RINGERS IV SOLN
INTRAVENOUS | Status: DC
  Administered 2015-05-29 (×2): via INTRAVENOUS

## 2015-05-29 MED ORDER — PIPERACILLIN-TAZOBACTAM 3.375 G IVPB
3.3750 g | Freq: Three times a day (TID) | INTRAVENOUS | Status: DC
Start: 2015-05-30 — End: 2015-05-30
  Administered 2015-05-30 (×2): 3.375 g via INTRAVENOUS
  Filled 2015-05-29 (×2): qty 50

## 2015-05-29 MED ORDER — SUCCINYLCHOLINE CHLORIDE 20 MG/ML IJ SOLN
INTRAMUSCULAR | Status: DC | PRN
Start: 2015-05-29 — End: 2015-05-29
  Administered 2015-05-29: 100 mg via INTRAVENOUS

## 2015-05-29 MED ORDER — ARIPIPRAZOLE 10 MG PO TABS
5.0000 mg | ORAL_TABLET | Freq: Two times a day (BID) | ORAL | Status: DC
  Administered 2015-05-29 – 2015-05-30 (×2): 5 mg via ORAL
  Filled 2015-05-29 (×2): qty 1

## 2015-05-29 MED ORDER — LIDOCAINE HCL (PF) 1 % IJ SOLN
INTRAMUSCULAR | Status: AC
  Filled 2015-05-29: qty 5

## 2015-05-29 MED ORDER — ARTIFICIAL TEARS OP OINT
TOPICAL_OINTMENT | OPHTHALMIC | Status: AC
  Filled 2015-05-29: qty 3.5

## 2015-05-29 MED ORDER — POVIDONE-IODINE 10 % EX OINT
TOPICAL_OINTMENT | CUTANEOUS | Status: AC
  Filled 2015-05-29: qty 1

## 2015-05-29 MED ORDER — DIPHENHYDRAMINE HCL 50 MG/ML IJ SOLN: 25.0000 mg | Freq: Four times a day (QID) | INTRAMUSCULAR | Status: DC | PRN

## 2015-05-29 MED ORDER — HYDROMORPHONE HCL 1 MG/ML IJ SOLN
1.0000 mg | INTRAMUSCULAR | Status: DC | PRN
  Administered 2015-05-29 (×2): 1 mg via INTRAVENOUS
  Filled 2015-05-29 (×2): qty 1

## 2015-05-29 MED ORDER — ONDANSETRON HCL 4 MG/2ML IJ SOLN
INTRAMUSCULAR | Status: DC | PRN
  Administered 2015-05-29: 4 mg via INTRAVENOUS

## 2015-05-29 MED ORDER — FENTANYL CITRATE (PF) 100 MCG/2ML IJ SOLN
INTRAMUSCULAR | Status: DC | PRN
  Administered 2015-05-29 (×5): 50 ug via INTRAVENOUS

## 2015-05-29 MED ORDER — FENTANYL CITRATE (PF) 250 MCG/5ML IJ SOLN
INTRAMUSCULAR | Status: AC
  Filled 2015-05-29: qty 5

## 2015-05-29 MED ORDER — PIPERACILLIN-TAZOBACTAM 3.375 G IVPB 30 MIN
3.3750 g | Freq: Once | INTRAVENOUS | Status: AC
  Administered 2015-05-29: 3.375 g via INTRAVENOUS
  Filled 2015-05-29: qty 50

## 2015-05-29 MED ORDER — ONDANSETRON 4 MG PO TBDP: 4.0000 mg | ORAL_TABLET | Freq: Four times a day (QID) | ORAL | Status: DC | PRN

## 2015-05-29 MED ORDER — POVIDONE-IODINE 10 % OINT PACKET
TOPICAL_OINTMENT | CUTANEOUS | Status: DC | PRN
  Administered 2015-05-29: 1 via TOPICAL

## 2015-05-29 MED ORDER — ONDANSETRON HCL 4 MG/2ML IJ SOLN: 4.0000 mg | Freq: Four times a day (QID) | INTRAMUSCULAR | Status: DC | PRN

## 2015-05-29 MED ORDER — DEXAMETHASONE SODIUM PHOSPHATE 4 MG/ML IJ SOLN
INTRAMUSCULAR | Status: DC | PRN
  Administered 2015-05-29: 8 mg via INTRAVENOUS

## 2015-05-29 MED ORDER — BUPIVACAINE HCL (PF) 0.5 % IJ SOLN
INTRAMUSCULAR | Status: AC
  Filled 2015-05-29: qty 30

## 2015-05-29 MED ORDER — FLUOXETINE HCL 20 MG PO TABS
20.0000 mg | ORAL_TABLET | Freq: Three times a day (TID) | ORAL | Status: DC
  Administered 2015-05-29: 20 mg via ORAL
  Filled 2015-05-29 (×6): qty 1

## 2015-05-29 MED ORDER — DIPHENHYDRAMINE HCL 25 MG PO CAPS: 25.0000 mg | ORAL_CAPSULE | Freq: Four times a day (QID) | ORAL | Status: DC | PRN

## 2015-05-29 MED ORDER — IOHEXOL 300 MG/ML  SOLN
100.0000 mL | Freq: Once | INTRAMUSCULAR | Status: AC | PRN
  Administered 2015-05-29: 100 mL via INTRAVENOUS

## 2015-05-29 MED ORDER — SIMETHICONE 80 MG PO CHEW: 40.0000 mg | CHEWABLE_TABLET | Freq: Four times a day (QID) | ORAL | Status: DC | PRN

## 2015-05-29 MED ORDER — SODIUM CHLORIDE 0.9 % IR SOLN
Status: DC | PRN
  Administered 2015-05-29: 500 mL

## 2015-05-29 MED ORDER — MIDAZOLAM HCL 2 MG/2ML IJ SOLN
INTRAMUSCULAR | Status: AC
  Filled 2015-05-29: qty 2

## 2015-05-29 MED ORDER — GLYCOPYRROLATE 0.2 MG/ML IJ SOLN
INTRAMUSCULAR | Status: DC | PRN
  Administered 2015-05-29: 0.2 mg via INTRAVENOUS

## 2015-05-29 MED ORDER — LORAZEPAM 2 MG/ML IJ SOLN: 1.0000 mg | INTRAMUSCULAR | Status: DC | PRN

## 2015-05-29 MED ORDER — DEXAMETHASONE SODIUM PHOSPHATE 4 MG/ML IJ SOLN
INTRAMUSCULAR | Status: AC
  Filled 2015-05-29: qty 2

## 2015-05-29 MED ORDER — PROPOFOL 10 MG/ML IV BOLUS
INTRAVENOUS | Status: DC | PRN
  Administered 2015-05-29: 120 mg via INTRAVENOUS

## 2015-05-29 MED ORDER — BUPIVACAINE HCL (PF) 0.5 % IJ SOLN
INTRAMUSCULAR | Status: DC | PRN
  Administered 2015-05-29: 8 mL

## 2015-05-29 MED ORDER — GLYCOPYRROLATE 0.2 MG/ML IJ SOLN
INTRAMUSCULAR | Status: AC
  Filled 2015-05-29: qty 1

## 2015-05-29 MED ORDER — OXYCODONE-ACETAMINOPHEN 5-325 MG PO TABS
1.0000 | ORAL_TABLET | ORAL | Status: DC | PRN
  Administered 2015-05-30: 1 via ORAL
  Filled 2015-05-29: qty 1

## 2015-05-29 MED ORDER — ACETAMINOPHEN 325 MG PO TABS: 650.0000 mg | ORAL_TABLET | Freq: Four times a day (QID) | ORAL | Status: DC | PRN

## 2015-05-29 MED ORDER — LIDOCAINE HCL (CARDIAC) 20 MG/ML IV SOLN
INTRAVENOUS | Status: DC | PRN
  Administered 2015-05-29: 50 mg via INTRAVENOUS

## 2015-05-29 MED ORDER — MIDAZOLAM HCL 5 MG/5ML IJ SOLN
INTRAMUSCULAR | Status: DC | PRN
  Administered 2015-05-29: 2 mg via INTRAVENOUS

## 2015-05-29 MED ORDER — ENOXAPARIN SODIUM 40 MG/0.4ML ~~LOC~~ SOLN
40.0000 mg | SUBCUTANEOUS | Status: DC
  Administered 2015-05-30: 40 mg via SUBCUTANEOUS
  Filled 2015-05-29: qty 0.4

## 2015-05-29 MED ORDER — SODIUM CHLORIDE 0.9 % IV BOLUS (SEPSIS)
500.0000 mL | Freq: Once | INTRAVENOUS | Status: AC
  Administered 2015-05-29: 500 mL via INTRAVENOUS

## 2015-05-29 MED ORDER — ONDANSETRON HCL 4 MG/2ML IJ SOLN
INTRAMUSCULAR | Status: AC
  Filled 2015-05-29: qty 2

## 2015-05-29 SURGICAL SUPPLY — 46 items
BAG HAMPER (MISCELLANEOUS) ×3 IMPLANT
CHLORAPREP W/TINT 26ML (MISCELLANEOUS) ×3 IMPLANT
CLOTH BEACON ORANGE TIMEOUT ST (SAFETY) ×3 IMPLANT
COVER LIGHT HANDLE STERIS (MISCELLANEOUS) ×6 IMPLANT
CUTTER FLEX LINEAR 45M (STAPLE) ×3 IMPLANT
CUTTER LINEAR ENDO 35 ART FLEX (STAPLE) IMPLANT
CUTTER LINEAR ENDO 35 ART THIN (STAPLE) IMPLANT
CUTTER LINEAR ENDO 35 ETS TH (STAPLE) IMPLANT
DECANTER SPIKE VIAL GLASS SM (MISCELLANEOUS) ×3 IMPLANT
DISSECTOR BLUNT TIP ENDO 5MM (MISCELLANEOUS) IMPLANT
ELECT REM PT RETURN 9FT ADLT (ELECTROSURGICAL) ×3
ELECTRODE REM PT RTRN 9FT ADLT (ELECTROSURGICAL) ×1 IMPLANT
EVACUATOR SMOKE 8.L (FILTER) ×3 IMPLANT
FORMALIN 10 PREFIL 120ML (MISCELLANEOUS) ×3 IMPLANT
GLOVE BIOGEL PI IND STRL 7.0 (GLOVE) ×1 IMPLANT
GLOVE BIOGEL PI INDICATOR 7.0 (GLOVE) ×2
GLOVE INDICATOR 7.0 STRL GRN (GLOVE) ×3 IMPLANT
GLOVE SURG SS PI 7.5 STRL IVOR (GLOVE) ×6 IMPLANT
GOWN STRL REUS W/ TWL XL LVL3 (GOWN DISPOSABLE) ×1 IMPLANT
GOWN STRL REUS W/TWL LRG LVL3 (GOWN DISPOSABLE) ×3 IMPLANT
GOWN STRL REUS W/TWL XL LVL3 (GOWN DISPOSABLE) ×2
INST SET LAPROSCOPIC AP (KITS) ×3 IMPLANT
IV NS IRRIG 3000ML ARTHROMATIC (IV SOLUTION) IMPLANT
KIT ROOM TURNOVER APOR (KITS) ×3 IMPLANT
MANIFOLD NEPTUNE II (INSTRUMENTS) ×3 IMPLANT
NEEDLE INSUFFLATION 14GA 120MM (NEEDLE) ×3 IMPLANT
NS IRRIG 1000ML POUR BTL (IV SOLUTION) ×3 IMPLANT
PACK LAP CHOLE LZT030E (CUSTOM PROCEDURE TRAY) ×3 IMPLANT
PAD ARMBOARD 7.5X6 YLW CONV (MISCELLANEOUS) ×3 IMPLANT
POUCH SPECIMEN RETRIEVAL 10MM (ENDOMECHANICALS) ×3 IMPLANT
RELOAD /EVU35 (ENDOMECHANICALS) IMPLANT
RELOAD 45 VASCULAR/THIN (ENDOMECHANICALS) ×3 IMPLANT
RELOAD CUTTER ETS 35MM STAND (ENDOMECHANICALS) IMPLANT
SET BASIN LINEN APH (SET/KITS/TRAYS/PACK) ×3 IMPLANT
SET TUBE IRRIG SUCTION NO TIP (IRRIGATION / IRRIGATOR) IMPLANT
SHEARS HARMONIC ACE PLUS 36CM (ENDOMECHANICALS) ×3 IMPLANT
SPONGE GAUZE 2X2 8PLY STER LF (GAUZE/BANDAGES/DRESSINGS) ×4
SPONGE GAUZE 2X2 8PLY STRL LF (GAUZE/BANDAGES/DRESSINGS) ×8 IMPLANT
STAPLER VISISTAT (STAPLE) ×3 IMPLANT
SUT VICRYL 0 UR6 27IN ABS (SUTURE) ×3 IMPLANT
TROCAR ENDO BLADELESS 11MM (ENDOMECHANICALS) ×3 IMPLANT
TROCAR ENDO BLADELESS 12MM (ENDOMECHANICALS) ×3 IMPLANT
TROCAR XCEL NON-BLD 5MMX100MML (ENDOMECHANICALS) ×3 IMPLANT
TUBING INSUFFLATION (TUBING) ×3 IMPLANT
WARMER LAPAROSCOPE (MISCELLANEOUS) ×3 IMPLANT
YANKAUER SUCT 12FT TUBE ARGYLE (SUCTIONS) ×3 IMPLANT

## 2015-05-29 NOTE — Op Note (Signed)
Patient:  Diane [name]  DOB:  11-Jul-1980  MRN:  [id number]   Preop Diagnosis:  Acute appendicitis  Postop Diagnosis:  Same  Procedure:  Laparoscopic appendectomy  Surgeon:  Franky MachoMark Jourden Gilson, M.D.  Anes:  Gen. endotracheal  Indications:  Patient is a 35 year old white female who presented to the emergency room with worsening right lower quadrant abdominal pain. CT scan of the abdomen revealed acute appendicitis. She now presents to the operating room for laparoscopic appendectomy. The risks and benefits of the procedure including bleeding, infection, and the possibility of an open procedure were fully explained to the patient, who gave informed consent.  Procedure note:  The patient was placed in the supine position. After induction of general endotracheal anesthesia, the abdomen was prepped and draped using the usual sterile technique with DuraPrep. Surgical site confirmation was performed.  A supraumbilical incision was made down to the fascia. A Veress needle was introduced into the abdominal cavity and confirmation of placement was done using the saline drop test. The abdomen was then insufflated to 16 mmHg pressure. An 11 mm trocar was introduced into the abdominal cavity under direct visualization without difficulty. The patient was placed in deeper Trendelenburg position and additional 12 mm trocar was placed the suprapubic region a 5 mm trocar was placed in the left lower quadrant region. The appendix was visualized and the distal half was noted to be acutely inflamed. The mesoappendix was divided using the harmonic scalpel. A vascular Endo GIA was placed across the base the appendix and fired. The staple line was inspected and noted to be within normal limits. The appendix was removed using an Endo Catch bag and sent to pathology further examination. All fluid and air were then evacuated from the abdominal cavity prior to removal of the trochars.  All wounds were irrigated with normal  saline. All wounds were injected with 0.5% Sensorcaine. The supraumbilical fascia as well as suprapubic fascia were reapproximated using 0 Vicryl interrupted sutures. All skin incisions were closed using staples. Betadine ointment and dry sterile dressings were applied.  All tape and needle counts were correct at the end of the procedure. The patient was extubated in the operating room and transferred to PACU in stable condition.  Complications:  None  EBL:  Minimal  Specimen:  Appendix

## 2015-05-29 NOTE — H&P (Signed)
Diane [name] is an 35 y.o. female.   Chief Complaint: Right lower quadrant abdominal pain HPI: Patient is a 35 year old white female who earlier today began experiencing right lower quadrant abdominal pain. She presented to the emergency room by EMS. CT scan of the abdomen reveals acute appendicitis.  Past Medical History  Diagnosis Date  . UTI (lower urinary tract infection)   . 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 Tubal ligation  2012  . Ureter removal      Family History  Problem Relation Age of Onset  . Diabetes Mother   . Stroke Father   . Heart failure Father   . Diabetes Father   . Epilepsy Brother    Social History:  reports that she has never smoked. She has never used smokeless tobacco. She reports that she does not drink alcohol or use illicit drugs.  Allergies:  Allergies  Allergen Reactions  . Latex Rash     (Not in a hospital admission)  Results for orders placed or performed during the hospital encounter of 05/29/15 (from the past 48 hour(s))  Urinalysis, Routine w reflex microscopic     Status: Abnormal   Collection Time: 05/29/15  1:30 PM  Result Value Ref Range   Color, Urine YELLOW YELLOW   APPearance HAZY (A) CLEAR   Specific Gravity, Urine <1.005 (L) 1.005 - 1.030   pH 6.0 5.0 - 8.0   Glucose, UA NEGATIVE NEGATIVE mg/dL   Hgb urine dipstick TRACE (A) NEGATIVE   Bilirubin Urine NEGATIVE NEGATIVE   Ketones, ur NEGATIVE NEGATIVE mg/dL   Protein, ur NEGATIVE NEGATIVE mg/dL   Nitrite NEGATIVE NEGATIVE   Leukocytes, UA TRACE (A) NEGATIVE  Urine microscopic-add on     Status: Abnormal   Collection Time: 05/29/15  1:30 PM  Result Value Ref Range   Squamous Epithelial / LPF 6-30 (A) NONE SEEN   WBC, UA 6-30 0 - 5 WBC/hpf   RBC / HPF 0-5 0 - 5 RBC/hpf   Bacteria, UA MANY (A) NONE SEEN  Comprehensive metabolic panel     Status: Abnormal   Collection Time: 05/29/15   1:38 PM  Result Value Ref Range   Sodium 138 135 - 145 mmol/L   Potassium 3.9 3.5 - 5.1 mmol/L   Chloride 106 101 - 111 mmol/L   CO2 24 22 - 32 mmol/L   Glucose, Bld 109 (H) 65 - 99 mg/dL   BUN 13 6 - 20 mg/dL   Creatinine, Ser 1.02 (H) 0.44 - 1.00 mg/dL   Calcium 8.8 (L) 8.9 - 10.3 mg/dL   Total Protein 7.2 6.5 - 8.1 g/dL   Albumin 4.2 3.5 - 5.0 g/dL   AST 13 (L) 15 - 41 U/L   ALT 8 (L) 14 - 54 U/L   Alkaline Phosphatase 43 38 - 126 U/L   Total Bilirubin 0.5 0.3 - 1.2 mg/dL   GFR calc non Af Amer >60 >60 mL/min   GFR calc Af Amer >60 >60 mL/min    Comment: (NOTE) The eGFR has been calculated using the CKD EPI equation. This calculation has not been validated in all clinical situations. eGFR's persistently <60 mL/min signify possible Chronic Kidney Disease.    Anion gap 8 5 - 15  CBC with Differential     Status: Abnormal   Collection Time: 05/29/15  1:38 PM  Result Value Ref Range   WBC  11.1 (H) 4.0 - 10.5 K/uL   RBC 4.27 3.87 - 5.11 MIL/uL   Hemoglobin 12.9 12.0 - 15.0 g/dL   HCT 37.8 36.0 - 46.0 %   MCV 88.5 78.0 - 100.0 fL   MCH 30.2 26.0 - 34.0 pg   MCHC 34.1 30.0 - 36.0 g/dL   RDW 12.5 11.5 - 15.5 %   Platelets 248 150 - 400 K/uL   Neutrophils Relative % 84 %   Neutro Abs 9.4 (H) 1.7 - 7.7 K/uL   Lymphocytes Relative 10 %   Lymphs Abs 1.1 0.7 - 4.0 K/uL   Monocytes Relative 5 %   Monocytes Absolute 0.5 0.1 - 1.0 K/uL   Eosinophils Relative 1 %   Eosinophils Absolute 0.1 0.0 - 0.7 K/uL   Basophils Relative 0 %   Basophils Absolute 0.0 0.0 - 0.1 K/uL  Lipase, blood     Status: None   Collection Time: 05/29/15  1:38 PM  Result Value Ref Range   Lipase 33 11 - 51 U/L  I-Stat beta hCG blood, ED     Status: None   Collection Time: 05/29/15  1:58 PM  Result Value Ref Range   I-stat hCG, quantitative <5.0 <5 mIU/mL   Comment 3            Comment:   GEST. AGE      CONC.  (mIU/mL)   <=1 WEEK        5 - 50     2 WEEKS       50 - 500     3 WEEKS       100 -  10,000     4 WEEKS     1,000 - 30,000        FEMALE AND NON-PREGNANT FEMALE:     LESS THAN 5 mIU/mL    Ct Abdomen Pelvis W Contrast  05/29/2015  CLINICAL DATA:  Right lower quadrant pain for 3 hours. EXAM: CT ABDOMEN AND PELVIS WITH CONTRAST TECHNIQUE: Multidetector CT imaging of the abdomen and pelvis was performed using the standard protocol following bolus administration of intravenous contrast. CONTRAST:  176m OMNIPAQUE IOHEXOL 300 MG/ML  SOLN COMPARISON:  None. FINDINGS: Distal appendix is thick-walled and distended to approximately 11 mm. A proximal appendicolith measures 4 mm. Mild periappendiceal inflammation. No periappendiceal abscess collection seen. No free intraperitoneal air. Liver, gallbladder, spleen, pancreas, and adrenal glands appear normal. Left kidney appears normal without stone or hydronephrosis. Right kidney is somewhat atrophic in size but otherwise unremarkable. Large and small bowel are normal in caliber. Moderate amount of stool throughout the colon. Adnexal regions are unremarkable. Lung bases are clear. No osseous abnormality. IMPRESSION: 1. Findings consistent with acute appendicitis. Distal appendix is thick-walled and distended to approximately 11 mm. Appendicolith within the more proximal appendix measures 4 mm. There is associated mild periappendiceal inflammation. No abscess collection seen. No free intraperitoneal air. 2. Atrophic right kidney, but appears functional with normal cortical uptake of contrast. No delayed images obtained to evaluate excretion of contrast into the collecting system. These results were called by telephone at the time of interpretation on 05/29/2015 at 3:05 pm to Dr. EDaleen Bo, who verbally acknowledged these results. Electronically Signed   By: SFranki CabotM.D.   On: 05/29/2015 15:05    Review of Systems  Constitutional: Positive for malaise/fatigue.  HENT: Negative.   Eyes: Negative.   Respiratory: Negative.   Cardiovascular:  Negative.   Gastrointestinal: Positive for abdominal pain.  Genitourinary:  Negative.   Musculoskeletal: Negative.   All other systems reviewed and are negative.   Blood pressure 111/73, pulse 67, temperature 98 F (36.7 C), temperature source Oral, resp. rate 14, height 5' 3" (1.6 m), weight 63.504 kg (140 lb), last menstrual period 05/06/2015, SpO2 98 %. Physical Exam  Constitutional: She is oriented to person, place, and time. She appears well-developed and well-nourished.  HENT:  Head: Normocephalic and atraumatic.  Neck: Normal range of motion. Neck supple.  Cardiovascular: Normal rate, regular rhythm and normal heart sounds.   Respiratory: Effort normal and breath sounds normal.  GI: Soft. She exhibits no distension. There is tenderness.  Tender in the right lower quadrant to palpation.  Neurological: She is alert and oriented to person, place, and time.  Skin: Skin is warm and dry.     Assessment/Plan Impression: Acute appendicitis Plan: Patient be taken to the operating room for laparoscopic appendectomy. The risks and benefits of the procedure including bleeding, infection, and the possibility of an open procedure were fully explained to the patient, who gave informed consent.  Zohar Maroney A 05/29/2015, 4:27 PM

## 2015-05-29 NOTE — ED Provider Notes (Signed)
CSN: [id number]     Arrival date & time 05/29/15  1249 History   First MD Initiated Contact with Patient 05/29/15 1258     Chief Complaint  Patient presents with  . Abdominal Pain     (Consider location/radiation/quality/duration/timing/severity/associated sxs/prior Treatment) HPI   Diane [name] is a 35 y.o. female who presents for evaluation of right upper quadrant abdominal pain, noting sensation, and pain with eating, all which started this morning. She tried to go to work but could not continue because of the pain. She denies fever, chills, vomiting, constipation or diarrhea. She has had some urinary frequency recently. Last menstrual cycle was about 3 weeks ago. She does not think she is pregnant. No known sick contacts. No chronic abdominal pain. 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 There are no other known modifying factors.  Past Medical History  Diagnosis Date  . UTI (lower urinary tract infection)   . 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 Tubal ligation  2012  . Ureter removal     Family History  Problem Relation Age of Onset  . Diabetes Mother   . Stroke Father   . Heart failure Father   . Diabetes Father   . Epilepsy Brother    Social History  Substance Use Topics  . Smoking status: Never Smoker   . Smokeless tobacco: Never Used  . Alcohol Use: No   OB History    Gravida Para Term Preterm AB TAB SAB Ectopic Multiple Living   6 6 1 5      6      Review of Systems  All other systems reviewed and are negative.     Allergies  Latex  Home Medications   Prior to Admission medications   Medication Sig Start Date End Date Taking? Authorizing Provider  acetaminophen (TYLENOL) 500 MG tablet Take 1,000 mg by mouth daily as needed for headache.    Historical Provider, MD   ARIPiprazole (ABILIFY) 5 MG tablet Take 5 mg by mouth 2 (two) times daily.    Historical Provider, MD  cephALEXin (KEFLEX) 500 MG capsule Take 1 capsule (500 mg total) by mouth 4 (four) times daily. 02/01/14   Gilda Crease, MD  FLUoxetine (PROZAC) 20 MG tablet Take 20 mg by mouth 3 (three) times daily.    Historical Provider, MD   BP 121/83 mmHg  Pulse 85  Temp(Src) 98.3 F (36.8 C) (Oral)  Resp 18  Ht 5\' 3"  (1.6 m)  Wt 140 lb (63.504 kg)  BMI 24.81 kg/m2  SpO2 100%  LMP 05/06/2015 Physical Exam  Constitutional: She is oriented to person, place, and time. She appears well-developed and well-nourished. No distress.  HENT:  Head: Normocephalic and atraumatic.  Right Ear: External ear normal.  Left Ear: External ear normal.  Eyes: Conjunctivae and EOM are normal. Pupils are equal, round, and reactive to light.  Neck: Normal range of motion and phonation normal. Neck supple.  Cardiovascular: Normal rate, regular rhythm and normal heart sounds.   Pulmonary/Chest: Effort normal and breath sounds normal. She exhibits no bony tenderness.  Abdominal: Soft. She exhibits no mass. There is tenderness (Right lower quadrant, mild.). There is no rebound and no guarding.  Musculoskeletal: Normal range of motion. She exhibits no edema or tenderness.  Neurological: She is alert and  oriented to person, place, and time. No cranial nerve deficit or sensory deficit. She exhibits normal muscle tone. Coordination normal.  Skin: Skin is warm, dry and intact.  Psychiatric: She has a normal mood and affect. Her behavior is normal. Judgment and thought content normal.  Nursing note and vitals reviewed.   ED Course  Procedures (including critical care time) Medications  lactated ringers infusion (not administered)  piperacillin-tazobactam (ZOSYN) IVPB 3.375 g (not administered)  sodium chloride 0.9 % bolus 500 mL (500 mLs Intravenous New Bag/Given 05/29/15 1331)  iohexol (OMNIPAQUE) 300 MG/ML  solution 100 mL (100 mLs Intravenous Contrast Given 05/29/15 1444)    Patient Vitals for the past 24 hrs:  BP Temp Temp src Pulse Resp SpO2 Height Weight  05/29/15 1527 111/73 mmHg 98 F (36.7 C) Oral 67 14 98 % - -  05/29/15 1254 121/83 mmHg 98.3 F (36.8 C) Oral 85 18 100 % 5\' 3"  (1.6 m) 140 lb (63.504 kg)    3:33 PM Reevaluation with update and discussion. After initial assessment and treatment, an updated evaluation reveals she is comfortable now. No vomiting or change in her abdominal pain. Findings discussed with the patient, all questions were answered. Diane [name]   15:40- discussed with general surgery, Dr. Lovell SheehanJenkins, who will come to the ED, see the patient and take her to the operating room  Labs Review Labs Reviewed - No data to display  Imaging Review No results found. I have personally reviewed and evaluated these images and lab results as part of my medical decision-making.   EKG Interpretation None      MDM   Final diagnoses:  Acute appendicitis, unspecified acute appendicitis type    Uncomplicated appendicitis. She will require surgical excision   Nursing Notes Reviewed/ Care Coordinated Applicable Imaging Reviewed Interpretation of Laboratory Data incorporated into ED treatment   Plan: Admit    Mancel BaleElliott Cathlin Buchan, MD 05/29/15 1546

## 2015-05-29 NOTE — Progress Notes (Signed)
Mrs. Diane [name] arrived to unit rm# [id number].  Report received from Yolanda MangesKaren OR, Charity fundraiserN.

## 2015-05-29 NOTE — Anesthesia Procedure Notes (Signed)
Procedure Name: Intubation Date/Time: 05/29/2015 5:24 PM Performed by: Pernell DupreADAMS, Demeka Sutter A Pre-anesthesia Checklist: Patient identified, Patient being monitored, Timeout performed, Emergency Drugs available and Suction available Patient Re-evaluated:Patient Re-evaluated prior to inductionOxygen Delivery Method: Circle System Utilized Preoxygenation: Pre-oxygenation with 100% oxygen Intubation Type: IV induction, Rapid sequence and Cricoid Pressure applied Ventilation: Mask ventilation without difficulty Laryngoscope Size: 3 and Miller Grade View: Grade I Tube type: Oral Tube size: 7.0 mm Number of attempts: 1 Airway Equipment and Method: Stylet Placement Confirmation: ETT inserted through vocal cords under direct vision,  positive ETCO2 and breath sounds checked- equal and bilateral Secured at: 21 cm Tube secured with: Tape Dental Injury: Teeth and Oropharynx as per pre-operative assessment

## 2015-05-29 NOTE — Anesthesia Preprocedure Evaluation (Addendum)
Anesthesia Evaluation  Patient identified by MRN, date of birth, ID band Patient awake    Reviewed: Allergy & Precautions, H&P   History of Anesthesia Complications Negative for: history of anesthetic complications  Airway Mallampati: I  TM Distance: >3 FB    Comment: Patient has a hoarse, raspy voice.  When questioned about it, she says  it has been that way for 5 years. Dental  (+) Teeth Intact   Pulmonary    breath sounds clear to auscultation       Cardiovascular  Rhythm:regular Rate:Normal     Neuro/Psych    GI/Hepatic   Endo/Other    Renal/GU Patient reports that "right kidney does not work"     Musculoskeletal   Abdominal   Peds  Hematology   Anesthesia Other Findings   Reproductive/Obstetrics negative OB ROS                           Anesthesia Physical Anesthesia Plan  ASA: I and emergent  Anesthesia Plan: General ETT, Rapid Sequence and Cricoid Pressure   Post-op Pain Management:    Induction: Rapid sequence, Intravenous and Cricoid pressure planned  Airway Management Planned: Oral ETT  Additional Equipment:   Intra-op Plan:   Post-operative Plan: Extubation in OR  Informed Consent: I have reviewed the patients History and Physical, chart, labs and discussed the procedure including the risks, benefits and alternatives for the proposed anesthesia with the patient or authorized representative who has indicated his/her understanding and acceptance.   Dental Advisory Given and Dental advisory given  Plan Discussed with: Anesthesiologist and Surgeon  Anesthesia Plan Comments:        Anesthesia Quick Evaluation

## 2015-05-29 NOTE — Transfer of Care (Signed)
Immediate Anesthesia Transfer of Care Note  Patient: Diane [name]  Procedure(s) Performed: Procedure(s): APPENDECTOMY LAPAROSCOPIC (N/A)  Patient Location: PACU  Anesthesia Type:General  Level of Consciousness: awake, alert , oriented and patient cooperative  Airway & Oxygen Therapy: Patient Spontanous Breathing and Patient connected to face mask oxygen  Post-op Assessment: Report given to RN and Post -op Vital signs reviewed and stable  Post vital signs: Reviewed and stable  Last Vitals:  Filed Vitals:   05/29/15 1254 05/29/15 1527  BP: 121/83 111/73  Pulse: 85 67  Temp: 36.8 C 36.7 C  Resp: 18 14    Complications: No apparent anesthesia complications

## 2015-05-29 NOTE — ED Notes (Signed)
Patient with RLQ abdominal pain x 3 hrs. Denies N/V/D. Denies GU s/s.

## 2015-05-29 NOTE — Anesthesia Postprocedure Evaluation (Signed)
Anesthesia Post Note  Patient: Diane [name]  Procedure(s) Performed: Procedure(s) (LRB): APPENDECTOMY LAPAROSCOPIC (N/A)  Patient location during evaluation: PACU Anesthesia Type: General Level of consciousness: awake and alert and oriented Pain management: pain level controlled (Patient still having moderate amount of pain; PACU RN to give  Dilaudid per Dr. Audie ClearJenklins order) Vital Signs Assessment: post-procedure vital signs reviewed and stable Respiratory status: spontaneous breathing and respiratory function stable Cardiovascular status: stable Postop Assessment: no signs of nausea or vomiting Anesthetic complications: no    Last Vitals:  Filed Vitals:   05/29/15 1805 05/29/15 1815  BP:  120/78  Pulse:  72  Temp: 36.9 C   Resp:  18    Last Pain:  Filed Vitals:   05/29/15 1822  PainSc: 6                  Gao Mitnick A

## 2015-05-30 LAB — BASIC METABOLIC PANEL
Anion gap: 7 (ref 5–15)
BUN: 13 mg/dL (ref 6–20)
CALCIUM: 8.7 mg/dL — AB (ref 8.9–10.3)
CO2: 24 mmol/L (ref 22–32)
CREATININE: 1.07 mg/dL — AB (ref 0.44–1.00)
Chloride: 107 mmol/L (ref 101–111)
GFR calc Af Amer: >60 mL/min (ref >60)
GFR calc non Af Amer: >60 mL/min (ref >60)
GLUCOSE: 134 mg/dL — AB (ref 65–99)
Potassium: 4.2 mmol/L (ref 3.5–5.1)
Sodium: 138 mmol/L (ref 135–145)

## 2015-05-30 LAB — CBC
HCT: 33.7 % — ABNORMAL LOW (ref 36.0–46.0)
HEMOGLOBIN: 11.2 g/dL — AB (ref 12.0–15.0)
MCH: 29.9 pg (ref 26.0–34.0)
MCHC: 33.2 g/dL (ref 30.0–36.0)
MCV: 90.1 fL (ref 78.0–100.0)
PLATELETS: 219 10*3/uL (ref 150–400)
RBC: 3.74 MIL/uL — ABNORMAL LOW (ref 3.87–5.11)
RDW: 12.8 % (ref 11.5–15.5)
WBC: 5.4 10*3/uL (ref 4.0–10.5)

## 2015-05-30 MED ORDER — OXYCODONE-ACETAMINOPHEN 7.5-325 MG PO TABS: 1.0000 | ORAL_TABLET | ORAL | Status: DC | PRN

## 2015-05-30 MED ORDER — FLUOXETINE HCL 20 MG PO CAPS
20.0000 mg | ORAL_CAPSULE | Freq: Three times a day (TID) | ORAL | Status: DC
  Administered 2015-05-30: 20 mg via ORAL
  Filled 2015-05-30: qty 1

## 2015-05-30 NOTE — Discharge Summary (Signed)
Physician Discharge Summary  Patient ID: Diane [name] MRN: [id number] DOB/AGE: 1980-06-01 34 y.o.  Admit date: 05/29/2015 Discharge date: 05/30/2015  Admission Diagnoses: Acute appendicitis  Discharge Diagnoses: Same Active Problems:   Acute appendicitis  anxiety disorder  Discharged Condition: good  Hospital Course: Patient is [name] 35 year old white female who presented emergency room with worsening right lower quadrant abdominal pain. She was found on CT scan of the abdomen to have acute appendicitis. She was taken to the operating room on 05/29/2015 for laparoscopic appendectomy. She tolerated the procedure well. Her postoperative course has been unremarkable. Her diet was advanced without difficulty. The patient is being discharged home on 05/30/2015 in good and improving condition.  Treatments: surgery: Laparoscopic appendectomy on 05/29/2015  Discharge Exam: Blood pressure 106/60, pulse 57, temperature 98.6 F (37 C), temperature source Oral, resp. rate 16, height 5\' 3"  (1.6 m), weight 62.46 kg (137 lb 11.2 oz), last menstrual period 05/06/2015, SpO2 98 %. General appearance: alert, cooperative and no distress Resp: clear to auscultation bilaterally Cardio: regular rate and rhythm, S1, S2 normal, no murmur, click, rub or gallop GI: Soft, flat. Dressings dry and intact.  Disposition: 01-Home or Self Care     Medication List    TAKE these medications        acetaminophen 500 MG tablet  Commonly known as:  TYLENOL  Take 1,000 mg by mouth daily as needed for headache.     ARIPiprazole 5 MG tablet  Commonly known as:  ABILIFY  Take 5 mg by mouth 2 (two) times daily.     FLUoxetine 20 MG tablet  Commonly known as:  PROZAC  Take 20 mg by mouth 3 (three) times daily.     oxyCODONE-acetaminophen 7.5-325 MG tablet  Commonly known as:  PERCOCET  Take 1-2 tablets by mouth every 4 (four) hours as needed.           Follow-up Information    Follow up with Dalia HeadingJENKINS,Adalie Mand  A, MD. Schedule an appointment as soon as possible for [name] visit on 06/08/2015.   Specialty:  General Surgery   Contact information:   [address] [place] [id number] [phone]       Signed: Franky MachoJENKINS,Diane [name] 05/30/2015, 1:07 PM

## 2015-05-30 NOTE — Discharge Instructions (Signed)
Laparoscopic Appendectomy, Adult, Care After °Refer to this sheet in the next few weeks. These instructions provide you with information on caring for yourself after your procedure. Your caregiver may also give you more specific instructions. Your treatment has been planned according to current medical practices, but problems sometimes occur. Call your caregiver if you have any problems or questions after your procedure. °HOME CARE INSTRUCTIONS °· Do not drive while taking narcotic pain medicines. °· Use stool softener if you become constipated from your pain medicines. °· Change your bandages (dressings) as directed. °· Keep your wounds clean and dry. You may wash the wounds gently with soap and water. Gently pat the wounds dry with a clean towel. °· Do not take baths, swim, or use hot tubs for 10 days, or as instructed by your caregiver. °· Only take over-the-counter or prescription medicines for pain, discomfort, or fever as directed by your caregiver. °· You may continue your normal diet as directed. °· Do not lift more than 10 pounds (4.5 kg) or play contact sports for 3 weeks, or as directed. °· Slowly increase your activity after surgery. °· Take deep breaths to avoid getting a lung infection (pneumonia). °SEEK MEDICAL CARE IF: °· You have redness, swelling, or increasing pain in your wounds. °· You have pus coming from your wounds. °· You have drainage from a wound that lasts longer than 1 day. °· You notice a bad smell coming from the wounds or dressing. °· Your wound edges break open after stitches (sutures) have been removed. °· You notice increasing pain in the shoulders (shoulder strap areas) or near your shoulder blades. °· You develop dizzy episodes or fainting while standing. °· You develop shortness of breath. °· You develop persistent nausea or vomiting. °· You cannot control your bowel functions or lose your appetite. °· You develop diarrhea. °SEEK IMMEDIATE MEDICAL CARE IF:  °· You have a  fever. °· You develop a rash. °· You have difficulty breathing or sharp pains in your chest. °· You develop any reaction or side effects to medicines given. °MAKE SURE YOU: °· Understand these instructions. °· Will watch your condition. °· Will get help right away if you are not doing well or get worse. °  °This information is not intended to replace advice given to you by your health care provider. Make sure you discuss any questions you have with your health care provider. °  °Document Released: 05/08/2005 Document Revised: 09/22/2014 Document Reviewed: 10/26/2014 °Elsevier Interactive Patient Education ©2016 [employer] ° °

## 2015-05-30 NOTE — Progress Notes (Signed)
Patient alert and oriented, independent, VSS, pt. Tolerating diet well. No complaints of pain or nausea. Pt. Had IV removed tip intact. Pt. Had prescriptions given. Pt. Voiced understanding of discharge instructions with no further questions. Pt. Discharged via wheelchair with auxilliary.  

## 2015-05-31 ENCOUNTER — Encounter (HOSPITAL_COMMUNITY): Payer: Self-pay | Admitting: General Surgery

## 2016-01-22 ENCOUNTER — Encounter (HOSPITAL_COMMUNITY): Payer: Self-pay | Admitting: Emergency Medicine

## 2016-01-22 ENCOUNTER — Emergency Department (HOSPITAL_COMMUNITY)
Admission: EM | Admit: 2016-01-22 | Discharge: 2016-01-22 | Disposition: A | Discharge status: Home / Self Care | Payer: Medicaid Other | Attending: Emergency Medicine | Admitting: Emergency Medicine

## 2016-01-22 DIAGNOSIS — N390 Urinary tract infection, site not specified: Secondary | ICD-10-CM | POA: Insufficient documentation

## 2016-01-22 DIAGNOSIS — Z79899 Other long term (current) drug therapy: Secondary | ICD-10-CM | POA: Insufficient documentation

## 2016-01-22 DIAGNOSIS — R609 Edema, unspecified: Secondary | ICD-10-CM

## 2016-01-22 DIAGNOSIS — R600 Localized edema: Secondary | ICD-10-CM | POA: Insufficient documentation

## 2016-01-22 LAB — URINALYSIS, ROUTINE W REFLEX MICROSCOPIC
BILIRUBIN URINE: NEGATIVE
Glucose, UA: NEGATIVE mg/dL
Ketones, ur: NEGATIVE mg/dL
Leukocytes, UA: NEGATIVE
NITRITE: POSITIVE — AB
PH: 7 (ref 5.0–8.0)
Protein, ur: NEGATIVE mg/dL
SPECIFIC GRAVITY, URINE: 1.01 (ref 1.005–1.030)

## 2016-01-22 LAB — CBC WITH DIFFERENTIAL/PLATELET
Basophils Absolute: 0 10*3/uL (ref 0.0–0.1)
Basophils Relative: 1 %
Eosinophils Absolute: 0.3 10*3/uL (ref 0.0–0.7)
Eosinophils Relative: 5 %
HEMATOCRIT: 34.3 % — AB (ref 36.0–46.0)
HEMOGLOBIN: 11.5 g/dL — AB (ref 12.0–15.0)
LYMPHS ABS: 1.9 10*3/uL (ref 0.7–4.0)
Lymphocytes Relative: 31 %
MCH: 30.3 pg (ref 26.0–34.0)
MCHC: 33.5 g/dL (ref 30.0–36.0)
MCV: 90.5 fL (ref 78.0–100.0)
MONOS PCT: 7 %
Monocytes Absolute: 0.5 10*3/uL (ref 0.1–1.0)
NEUTROS ABS: 3.5 10*3/uL (ref 1.7–7.7)
NEUTROS PCT: 56 %
Platelets: 223 10*3/uL (ref 150–400)
RBC: 3.79 MIL/uL — AB (ref 3.87–5.11)
RDW: 13.1 % (ref 11.5–15.5)
WBC: 6.2 10*3/uL (ref 4.0–10.5)

## 2016-01-22 LAB — BASIC METABOLIC PANEL
ANION GAP: 1 — AB (ref 5–15)
BUN: 14 mg/dL (ref 6–20)
CHLORIDE: 111 mmol/L (ref 101–111)
CO2: 26 mmol/L (ref 22–32)
Calcium: 8.3 mg/dL — ABNORMAL LOW (ref 8.9–10.3)
Creatinine, Ser: 0.94 mg/dL (ref 0.44–1.00)
GFR calc non Af Amer: >60 mL/min (ref >60)
Glucose, Bld: 79 mg/dL (ref 65–99)
POTASSIUM: 3.5 mmol/L (ref 3.5–5.1)
Sodium: 138 mmol/L (ref 135–145)

## 2016-01-22 LAB — PREGNANCY, URINE: Preg Test, Ur: NEGATIVE

## 2016-01-22 LAB — TROPONIN I: Troponin I: <0.03 ng/mL (ref <0.03)

## 2016-01-22 LAB — URINE MICROSCOPIC-ADD ON

## 2016-01-22 LAB — D-DIMER, QUANTITATIVE (NOT AT ARMC): D DIMER QUANT: 0.38 ug{FEU}/mL (ref 0.00–0.50)

## 2016-01-22 MED ORDER — SULFAMETHOXAZOLE-TRIMETHOPRIM 800-160 MG PO TABS
1.0000 | ORAL_TABLET | Freq: Once | ORAL | Status: AC
  Administered 2016-01-22: 1 via ORAL
  Filled 2016-01-22: qty 1

## 2016-01-22 MED ORDER — MORPHINE SULFATE (PF) 4 MG/ML IV SOLN
4.0000 mg | Freq: Once | INTRAVENOUS | Status: AC
  Administered 2016-01-22: 4 mg via INTRAVENOUS
  Filled 2016-01-22: qty 1

## 2016-01-22 MED ORDER — SODIUM CHLORIDE 0.9 % IV BOLUS (SEPSIS)
1000.0000 mL | Freq: Once | INTRAVENOUS | Status: AC
  Administered 2016-01-22: 1000 mL via INTRAVENOUS

## 2016-01-22 MED ORDER — SULFAMETHOXAZOLE-TRIMETHOPRIM 800-160 MG PO TABS: 1.0000 | ORAL_TABLET | Freq: Two times a day (BID) | ORAL | 0 refills | Status: AC

## 2016-01-22 MED ORDER — ONDANSETRON HCL 4 MG/2ML IJ SOLN
4.0000 mg | Freq: Once | INTRAMUSCULAR | Status: AC
  Administered 2016-01-22: 4 mg via INTRAVENOUS
  Filled 2016-01-22: qty 2

## 2016-01-22 MED ORDER — HYDROCODONE-ACETAMINOPHEN 5-325 MG PO TABS
1.0000 | ORAL_TABLET | Freq: Once | ORAL | Status: AC
  Administered 2016-01-22: 1 via ORAL
  Filled 2016-01-22: qty 1

## 2016-01-22 MED ORDER — HYDROCODONE-ACETAMINOPHEN 5-325 MG PO TABS: 1.0000 | ORAL_TABLET | ORAL | 0 refills | Status: DC | PRN

## 2016-01-22 NOTE — ED Triage Notes (Signed)
PT c/o bilateral lower leg pain and swelling x2 weeks worsening yesterday with reported tingling feeling. PT also c/o generalized weakness.

## 2016-01-22 NOTE — ED Provider Notes (Signed)
[facility] Provider Note   CSN: [id number] Arrival date & time: 01/22/16  1738     History   Chief Complaint Chief Complaint  Patient presents with  . Leg Swelling    HPI Diane [name] is a 35 y.o. female.  Pt presents to the ED today with leg swelling and pain.  Right worse than left.  She said that it's been going on for 2 weeks and is getting worse.  She said that she has dizziness now.  She denies sob or cp.  Pt said that she feels throbbing in her legs.      Past Medical History:  Diagnosis Date  . 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 UTI (lower urinary tract infection)     Patient Active Problem List   Diagnosis Date Noted  . Acute appendicitis 05/29/2015  . Poor posture 09/09/2013    Past Surgical History:  Procedure Laterality Date  . APPENDECTOMY    . LAPAROSCOPIC APPENDECTOMY N/A 05/29/2015   Procedure: APPENDECTOMY LAPAROSCOPIC;  Surgeon: Franky MachoMark Jenkins, MD;  Location: [facility];  Service: General;  Laterality: N/A;  . TUBAL LIGATION  2012  . URETER REMOVAL      OB History    Gravida Para Term Preterm AB Living   6 6 1 5   6    SAB TAB Ectopic Multiple Live Births                   Home Medications    Prior to Admission medications   Medication Sig Start Date End Date Taking? Authorizing Provider  acetaminophen (TYLENOL) 500 MG tablet Take 1,000 mg by mouth daily as needed for headache.   Yes Historical Provider, MD  HYDROcodone-acetaminophen (NORCO/VICODIN) 5-325 MG tablet Take 1 tablet by mouth every 4 (four) hours as needed. 01/22/16   Jacalyn LefevreJulie Lochlin Eppinger, MD  sulfamethoxazole-trimethoprim (BACTRIM DS,SEPTRA DS) 800-160 MG tablet Take 1 tablet by mouth 2 (two) times daily. 01/22/16 01/29/16  Jacalyn LefevreJulie Richardine Peppers, MD    Family History Family History  Problem Relation Age of Onset  . Diabetes Mother   . Stroke Father   . Heart failure Father   . Diabetes Father   . Epilepsy Brother     Social History Social History    Substance Use Topics  . Smoking status: Never Smoker  . Smokeless tobacco: Never Used  . Alcohol use No     Allergies   Latex   Review of Systems Review of Systems  Musculoskeletal:       Leg pain and swelling  Neurological: Positive for dizziness.  All other systems reviewed and are negative.    Physical Exam Updated Vital Signs BP 122/83 (BP Location: Left Arm)   Pulse 71   Temp 98.2 F (36.8 C) (Oral)   Resp 18   Ht 5\' 3"  (1.6 m)   Wt 140 lb (63.5 kg)   LMP 12/29/2015   SpO2 100%   BMI 24.80 kg/m   Physical Exam  Constitutional: She is oriented to person, place, and time. She appears well-developed and well-nourished.  HENT:  Head: Normocephalic and atraumatic.  Right Ear: External ear normal.  Left Ear: External ear normal.  Nose: Nose normal.  Mouth/Throat: Oropharynx is clear and moist.  Eyes: Conjunctivae and EOM are normal. Pupils are equal, round, and reactive to light.  Neck: Normal range of motion. Neck supple.  Cardiovascular: Normal rate, regular rhythm, normal heart sounds and intact distal pulses.  Pulmonary/Chest: Effort normal and breath sounds normal.  Abdominal: Soft. Bowel sounds are normal.  Musculoskeletal: Normal range of motion.  Neurological: She is alert and oriented to person, place, and time.  Skin: Skin is warm and dry. Capillary refill takes less than 2 seconds.  Psychiatric: She has a normal mood and affect. Her behavior is normal. Judgment and thought content normal.  Nursing note and vitals reviewed.    ED Treatments / Results  Labs (all labs ordered are listed, but only abnormal results are displayed) Labs Reviewed  BASIC METABOLIC PANEL - Abnormal; Notable for the following:       Result Value   Calcium 8.3 (*)    Anion gap 1 (*)    All other components within normal limits  CBC WITH DIFFERENTIAL/PLATELET - Abnormal; Notable for the following:    RBC 3.79 (*)    Hemoglobin 11.5 (*)    HCT 34.3 (*)    All other  components within normal limits  URINALYSIS, ROUTINE W REFLEX MICROSCOPIC (NOT AT [facility]) - Abnormal; Notable for the following:    Hgb urine dipstick SMALL (*)    Nitrite POSITIVE (*)    All other components within normal limits  URINE MICROSCOPIC-ADD ON - Abnormal; Notable for the following:    Squamous Epithelial / LPF 6-30 (*)    Bacteria, UA MANY (*)    All other components within normal limits  PREGNANCY, URINE  D-DIMER, QUANTITATIVE (NOT AT [facility])  TROPONIN I    EKG  EKG Interpretation  Date/Time:  Saturday January 22 2016 18:06:59 EDT Ventricular Rate:  66 PR Interval:    QRS Duration: 85 QT Interval:  400 QTC Calculation: 420 R Axis:   82 Text Interpretation:  Sinus rhythm Confirmed by Calton Harshfield MD, Jazzmon Prindle ([id number]) on 01/22/2016 6:10:50 PM       Radiology No results found.  Procedures Procedures (including critical care time)  Medications Ordered in ED Medications  sulfamethoxazole-trimethoprim (BACTRIM DS,SEPTRA DS) 800-160 MG per tablet 1 tablet (not administered)  HYDROcodone-acetaminophen (NORCO/VICODIN) 5-325 MG per tablet 1 tablet (not administered)  morphine 4 MG/ML injection 4 mg (4 mg Intravenous Given 01/22/16 1807)  ondansetron (ZOFRAN) injection 4 mg (4 mg Intravenous Given 01/22/16 1807)  sodium chloride 0.9 % bolus 1,000 mL (0 mLs Intravenous Stopped 01/22/16 1939)     Initial Impression / Assessment and Plan / ED Course  I have reviewed the triage vital signs and the nursing notes.  Pertinent labs & imaging results that were available during my care of the patient were reviewed by me and considered in my medical decision making (see chart for details).  Clinical Course   D-dimer is negative, but due to the fact that pt's leg is swollen and painful, I want a lower extremity US to r/o DVT.  Unfortunately, [place] is not here now.  We ordered an [place] for pt for tomorrow at 9.  I think the likelihood of DVT is low, so I am not going to give her a shot of lovenox  tonight.   Pt knows to return tomorrow at 8:45 for her [place].  Return earlier if worse.   Final Clinical Impressions(s) / ED Diagnoses   Final diagnoses:  Peripheral edema  UTI (lower urinary tract infection)    New Prescriptions New Prescriptions   HYDROCODONE-ACETAMINOPHEN (NORCO/VICODIN) 5-325 MG TABLET    Take 1 tablet by mouth every 4 (four) hours as needed.   SULFAMETHOXAZOLE-TRIMETHOPRIM (BACTRIM DS,SEPTRA DS) 800-160 MG TABLET    Take 1  tablet by mouth 2 (two) times daily.     Jacalyn Lefevre, MD 01/22/16 1944

## 2016-01-23 ENCOUNTER — Ambulatory Visit (HOSPITAL_COMMUNITY)
Admit: 2016-01-23 | Discharge: 2016-01-23 | Disposition: A | Discharge status: Home / Self Care | Payer: Medicaid Other | Attending: Emergency Medicine | Admitting: Emergency Medicine

## 2016-01-23 ENCOUNTER — Other Ambulatory Visit (HOSPITAL_COMMUNITY): Payer: Medicaid Other

## 2016-01-23 DIAGNOSIS — M7989 Other specified soft tissue disorders: Secondary | ICD-10-CM | POA: Insufficient documentation

## 2016-03-09 ENCOUNTER — Emergency Department (HOSPITAL_COMMUNITY): Payer: Self-pay

## 2016-03-09 ENCOUNTER — Emergency Department (HOSPITAL_COMMUNITY)
Admission: EM | Admit: 2016-03-09 | Discharge: 2016-03-10 | Disposition: A | Discharge status: Home / Self Care | Payer: Self-pay | Attending: Emergency Medicine | Admitting: Emergency Medicine

## 2016-03-09 ENCOUNTER — Encounter (HOSPITAL_COMMUNITY): Payer: Self-pay | Admitting: Emergency Medicine

## 2016-03-09 DIAGNOSIS — R51 Headache: Secondary | ICD-10-CM | POA: Insufficient documentation

## 2016-03-09 DIAGNOSIS — Z91040 Latex allergy status: Secondary | ICD-10-CM | POA: Insufficient documentation

## 2016-03-09 DIAGNOSIS — R519 Headache, unspecified: Secondary | ICD-10-CM

## 2016-03-09 DIAGNOSIS — R42 Dizziness and giddiness: Secondary | ICD-10-CM | POA: Insufficient documentation

## 2016-03-09 DIAGNOSIS — Z791 Long term (current) use of non-steroidal anti-inflammatories (NSAID): Secondary | ICD-10-CM | POA: Insufficient documentation

## 2016-03-09 MED ORDER — METOCLOPRAMIDE HCL 5 MG/ML IJ SOLN
10.0000 mg | Freq: Once | INTRAMUSCULAR | Status: AC
  Administered 2016-03-09: 10 mg via INTRAVENOUS
  Filled 2016-03-09: qty 2

## 2016-03-09 MED ORDER — DIPHENHYDRAMINE HCL 50 MG/ML IJ SOLN
25.0000 mg | Freq: Once | INTRAMUSCULAR | Status: AC
  Administered 2016-03-09: 25 mg via INTRAVENOUS
  Filled 2016-03-09: qty 1

## 2016-03-09 MED ORDER — SODIUM CHLORIDE 0.9 % IV BOLUS (SEPSIS)
1000.0000 mL | Freq: Once | INTRAVENOUS | Status: AC
  Administered 2016-03-09: 1000 mL via INTRAVENOUS

## 2016-03-09 NOTE — ED Triage Notes (Signed)
Pt c/o headache, fever, and dizziness since Monday.

## 2016-03-09 NOTE — ED Provider Notes (Signed)
[facility] Provider Note   CSN: [id number] Arrival date & time: 03/09/16  2228  By signing my name below, I, Phillis HaggisGabriella Gaje, attest that this documentation has been prepared under the direction and in the presence of Devoria AlbeIva Nyeisha Goodall, MD. Electronically Signed: Phillis HaggisGabriella Gaje, ED Scribe. 03/09/16. 11:23 PM.  Time seen 23:13 PM  History   Chief Complaint Chief Complaint  Patient presents with  . Migraine   The history is provided by the patient. No language interpreter was used.   HPI Comments: Diane [name] is a 35 y.o. female who presents to the Emergency Department complaining of a gradually worsening, frontal headache onset 3 days ago. She said that about 9:40 PM, it felt like "a sledgehammer hit me on the top of the head." The sharp pain lasted for 3 minutes. Since then she has had other episodes of sharp pains that last 2-3 minutes. She reports associated dizziness, mild photophobia, +sound sensitivity, chills, and subjective fever. Pt has taken Ibuprofen, Goody powder, Tylenol, and cold compresses to no relief. Her last dose of medication was at 9 AM earlier today. Pt has intermittent worsening pain with bending at the waist. Nothing makes it feel better.  Pt notes that her maternal grandmother died of a brain aneurysm in her 5880s. She denies a hx of similar symptoms. She denies family hx of migraines, sick contacts, visual changes, cough, sore throat, rhinorrhea, nausea, vomiting, diarrhea, rash, abnormal gait, numbness, or weakness. Family denies confusion. She denies change in activity prior to her headache or any increased stress. Pt currently does not have a PCP. She does not smoke or drink. Pt is left handed.   PCP none  Past Medical History:  Diagnosis Date  . 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 UTI (lower urinary tract infection)     Patient Active Problem List   Diagnosis Date Noted  . Acute appendicitis 05/29/2015  . Poor posture  09/09/2013    Past Surgical History:  Procedure Laterality Date  . APPENDECTOMY    . LAPAROSCOPIC APPENDECTOMY N/A 05/29/2015   Procedure: APPENDECTOMY LAPAROSCOPIC;  Surgeon: Franky MachoMark Jenkins, MD;  Location: [facility];  Service: General;  Laterality: N/A;  . TUBAL LIGATION  2012  . URETER REMOVAL      OB History    Gravida Para Term Preterm AB Living   6 6 1 5   6    SAB TAB Ectopic Multiple Live Births                  Home Medications    Prior to Admission medications   Medication Sig Start Date End Date Taking? Authorizing Provider  ibuprofen (ADVIL,MOTRIN) 200 MG tablet Take 800 mg by mouth every 6 (six) hours as needed for headache, mild pain or moderate pain.   Yes Historical Provider, MD  HYDROcodone-acetaminophen (NORCO/VICODIN) 5-325 MG tablet Take 1 tablet by mouth every 4 (four) hours as needed. Patient not taking: Reported on 03/09/2016 01/22/16   Jacalyn LefevreJulie Haviland, MD    Family History Family History  Problem Relation Age of Onset  . Diabetes Mother   . Stroke Father   . Heart failure Father   . Diabetes Father   . Epilepsy Brother     Social History Social History  Substance Use Topics  . Smoking status: Never Smoker  . Smokeless tobacco: Never Used  . Alcohol use No  employed Lives with spouse   Allergies   Latex   Review of  Systems Review of Systems  Constitutional: Positive for chills.  HENT: Negative for rhinorrhea and sore throat.   Eyes: Positive for photophobia. Negative for visual disturbance.  Respiratory: Negative for cough.   Gastrointestinal: Negative for diarrhea, nausea and vomiting.  Musculoskeletal: Negative for gait problem.  Skin: Negative for rash.  Neurological: Positive for dizziness and headaches. Negative for weakness and numbness.  Psychiatric/Behavioral: Negative for confusion.   Physical Exam Updated Vital Signs BP 122/77 (BP Location: Left Arm)   Pulse 64   Temp 97.9 F (36.6 C) (Oral)   Resp 18   Ht 5\' 3"  (1.6 m)    Wt 135 lb (61.2 kg)   LMP 02/23/2016   SpO2 99%   BMI 23.91 kg/m   Vital signs normal    Physical Exam  Constitutional: She is oriented to person, place, and time. She appears well-developed and well-nourished.  Non-toxic appearance. She does not appear ill. No distress.  Pt is sitting calmly in a well lighted room and is in no apparent distress  HENT:  Head: Normocephalic and atraumatic.  Right Ear: External ear normal.  Left Ear: External ear normal.  Nose: Nose normal. No mucosal edema or rhinorrhea.  Mouth/Throat: Oropharynx is clear and moist and mucous membranes are normal. No dental abscesses or uvula swelling.  Eyes: Conjunctivae and EOM are normal. Pupils are equal, round, and reactive to light.  Neck: Normal range of motion and full passive range of motion without pain. Neck supple. No neck rigidity.  When she turns her head left, she has discomfort on the right side of her neck; when she turns her head right, she has discomfort on the left of her neck; no nuchal rigidity, specifically when she looks down towards her chest it is not painful  Cardiovascular: Normal rate, regular rhythm and normal heart sounds.  Exam reveals no gallop and no friction rub.   No murmur heard. Pulmonary/Chest: Effort normal and breath sounds normal. No respiratory distress. She has no wheezes. She has no rhonchi. She has no rales. She exhibits no tenderness and no crepitus.  Abdominal: Soft. Normal appearance and bowel sounds are normal. She exhibits no distension. There is no tenderness. There is no rebound and no guarding.  Musculoskeletal: Normal range of motion. She exhibits no edema or tenderness.  Moves all extremities well.   Neurological: She is alert and oriented to person, place, and time. She has normal strength. No cranial nerve deficit.  Grips are equal; no pronator drift; no lower motor weakness  Skin: Skin is warm, dry and intact. No rash noted. No erythema. No pallor.  Psychiatric:  She has a normal mood and affect. Her speech is normal and behavior is normal. Her mood appears not anxious.  Nursing note and vitals reviewed.   ED Treatments / Results  Labs (all labs ordered are listed, but only abnormal results are displayed) Results for orders placed or performed during the hospital encounter of 03/09/16  I-stat Chem 8, ED  Result Value Ref Range   Sodium 140 135 - 145 mmol/L   Potassium 3.8 3.5 - 5.1 mmol/L   Chloride 108 101 - 111 mmol/L   BUN 19 6 - 20 mg/dL   Creatinine, Ser 4.54 0.44 - 1.00 mg/dL   Glucose, Bld 098 (H) 65 - 99 mg/dL   Calcium, Ion 1.19 (L) 1.15 - 1.40 mmol/L   TCO2 21 0 - 100 mmol/L   Hemoglobin 11.2 (L) 12.0 - 15.0 g/dL   HCT 14.7 (L) 82.9 -  46.0 %   Laboratory interpretation all normal except mild anemia       Radiology Ct Angio Head W Or Wo Contrast  Result Date: 03/10/2016 CLINICAL DATA:  Initial evaluation for acute headache. Family history of brain aneurysm. EXAM: CT ANGIOGRAPHY HEAD TECHNIQUE: Multidetector CT imaging of the head was performed using the standard protocol during bolus administration of intravenous contrast. Multiplanar CT image reconstructions and MIPs were obtained to evaluate the vascular anatomy. CONTRAST:  80 cc of Isovue 370. COMPARISON:  Prior noncontrast head CT from earlier the same day. FINDINGS: CTA HEAD Anterior circulation: Petrous, cavernous, and supraclinoid segments of the internal carotid arteries are widely patent without stenosis or other abnormality. A1 segments patent. Anterior communicating artery normal. Anterior cerebral arteries well opacified to their distal aspects. M1 segments widely patent without stenosis or occlusion. MCA bifurcation normal. Distal MCA branches well opacified and symmetric. Posterior circulation: Vertebral arteries patent to the vertebrobasilar junction. Basilar artery widely patent to its distal aspect. Superior cerebral arteries patent bilaterally. Both of the posterior  cerebral arteries largely supplied via the basilar artery, and are well opacified to their distal aspects. Venous sinuses: Patent.  No evidence for venous sinus thrombosis. Anatomic variants: No anatomic variant. No aneurysm or vascular malformation. Delayed phase: No pathologic enhancement. IMPRESSION: Normal CTA of the brain. Electronically Signed   By: Rise Mu M.D.   On: 03/10/2016 03:01   Ct Head Wo Contrast  Result Date: 03/10/2016 CLINICAL DATA:  Initial evaluation for acute headache. EXAM: CT HEAD WITHOUT CONTRAST TECHNIQUE: Contiguous axial images were obtained from the base of the skull through the vertex without intravenous contrast. COMPARISON:  None. FINDINGS: Brain: Cerebral volume normal. Gray-white matter differentiation well maintained. No acute intracranial hemorrhage. No evidence for acute large vessel territory infarct. No mass lesion, midline shift or mass effect. No hydrocephalus. No extra-axial fluid collection. Vascular: No hyperdense vessel. Skull: Scalp soft tissues within normal limits.  Calvarium intact. Sinuses/Orbits: Globes and orbital soft tissues normal. Visualized paranasal sinuses and mastoid air cells are clear. IMPRESSION: Normal head CT.  No acute intracranial process identified. Electronically Signed   By: Rise Mu M.D.   On: 03/10/2016 01:07    Procedures Procedures (including critical care time)  Medications Ordered in ED Medications  sodium chloride 0.9 % bolus 1,000 mL (0 mLs Intravenous Stopped 03/10/16 0119)  metoCLOPramide (REGLAN) injection 10 mg (10 mg Intravenous Given 03/09/16 2349)  diphenhydrAMINE (BENADRYL) injection 25 mg (25 mg Intravenous Given 03/09/16 2347)  magnesium sulfate IVPB 1 g 100 mL (0 g Intravenous Stopped 03/10/16 0128)  iopamidol (ISOVUE-370) 76 % injection 80 mL (80 mLs Intravenous Contrast Given 03/10/16 0208)     Initial Impression / Assessment and Plan / ED Course    Clinical Course   11:21  PM-Discussed treatment plan which includes IV medications and CT scan with pt at bedside and pt agreed to plan.   01:00 AM recheck, states her headache is almost gone. Will add magnesium, I have looked at her CT and don't see any obvious abnormality, still waiting for official reading.   We discussed her noncontrast head CT results. I explained to her that I did not know she had an aneurysm or not. Patient wants to know. We discussed getting a CTA tonight in ED or getting a MRI which would be less radiation but she will have to come back as an outpatient. She states she would prefer to know tonight. CTA of the brain was ordered.  Recheck  at 3:15 AM patient states her headaches almost gone. We discussed her CTA results. She was discharged home with migraine headache information.   I have reviewed the triage vital signs and the nursing notes.  Pertinent labs & imaging results that were available during my care of the patient were reviewed by me and considered in my medical decision making (see chart for details).  Final Clinical Impressions(s) / ED Diagnoses   Final diagnoses:  Acute nonintractable headache, unspecified headache type    Plan discharge  Devoria Albe, MD, FACEP   I personally performed the services described in this documentation, which was scribed in my presence. The recorded information has been reviewed and considered.  Devoria Albe, MD, Concha Pyo, MD 03/10/16 [phone]

## 2016-03-10 ENCOUNTER — Emergency Department (HOSPITAL_COMMUNITY): Payer: Self-pay

## 2016-03-10 LAB — I-STAT CHEM 8, ED
BUN: 19 mg/dL (ref 6–20)
CALCIUM ION: 1.12 mmol/L — AB (ref 1.15–1.40)
CHLORIDE: 108 mmol/L (ref 101–111)
CREATININE: 0.9 mg/dL (ref 0.44–1.00)
GLUCOSE: 109 mg/dL — AB (ref 65–99)
HCT: 33 % — ABNORMAL LOW (ref 36.0–46.0)
Hemoglobin: 11.2 g/dL — ABNORMAL LOW (ref 12.0–15.0)
POTASSIUM: 3.8 mmol/L (ref 3.5–5.1)
Sodium: 140 mmol/L (ref 135–145)
TCO2: 21 mmol/L (ref 0–100)

## 2016-03-10 MED ORDER — IOPAMIDOL (ISOVUE-370) INJECTION 76%
80.0000 mL | Freq: Once | INTRAVENOUS | Status: AC | PRN
  Administered 2016-03-10: 80 mL via INTRAVENOUS

## 2016-03-10 MED ORDER — MAGNESIUM SULFATE IN D5W 1-5 GM/100ML-% IV SOLN
1.0000 g | Freq: Once | INTRAVENOUS | Status: AC
  Administered 2016-03-10: 1 g via INTRAVENOUS
  Filled 2016-03-10: qty 100

## 2016-03-10 NOTE — Discharge Instructions (Signed)
Drink plenty of fluids. You can take ibuprofen 600 mg + acetaminophen 650 mg every 6 hrs as needed for headache if it returns. Recheck if you have any problems listed in the discharge information.

## 2016-03-15 ENCOUNTER — Encounter (HOSPITAL_COMMUNITY): Payer: Self-pay | Admitting: *Deleted

## 2016-03-15 ENCOUNTER — Emergency Department (HOSPITAL_COMMUNITY)
Admission: EM | Admit: 2016-03-15 | Discharge: 2016-03-15 | Disposition: A | Discharge status: Home / Self Care | Payer: Medicaid Other | Attending: Emergency Medicine | Admitting: Emergency Medicine

## 2016-03-15 DIAGNOSIS — Z791 Long term (current) use of non-steroidal anti-inflammatories (NSAID): Secondary | ICD-10-CM | POA: Insufficient documentation

## 2016-03-15 DIAGNOSIS — R51 Headache: Secondary | ICD-10-CM | POA: Insufficient documentation

## 2016-03-15 DIAGNOSIS — M542 Cervicalgia: Secondary | ICD-10-CM | POA: Insufficient documentation

## 2016-03-15 DIAGNOSIS — R42 Dizziness and giddiness: Secondary | ICD-10-CM | POA: Insufficient documentation

## 2016-03-15 DIAGNOSIS — Z79899 Other long term (current) drug therapy: Secondary | ICD-10-CM | POA: Insufficient documentation

## 2016-03-15 DIAGNOSIS — R519 Headache, unspecified: Secondary | ICD-10-CM

## 2016-03-15 DIAGNOSIS — R110 Nausea: Secondary | ICD-10-CM | POA: Insufficient documentation

## 2016-03-15 MED ORDER — PROCHLORPERAZINE EDISYLATE 5 MG/ML IJ SOLN
10.0000 mg | Freq: Once | INTRAMUSCULAR | Status: AC
  Administered 2016-03-15: 10 mg via INTRAVENOUS
  Filled 2016-03-15: qty 2

## 2016-03-15 MED ORDER — KETOROLAC TROMETHAMINE 30 MG/ML IJ SOLN
30.0000 mg | Freq: Once | INTRAMUSCULAR | Status: AC
  Administered 2016-03-15: 30 mg via INTRAVENOUS
  Filled 2016-03-15: qty 1

## 2016-03-15 MED ORDER — DEXAMETHASONE SODIUM PHOSPHATE 10 MG/ML IJ SOLN
10.0000 mg | Freq: Once | INTRAMUSCULAR | Status: AC
  Administered 2016-03-15: 10 mg via INTRAVENOUS
  Filled 2016-03-15: qty 1

## 2016-03-15 MED ORDER — SODIUM CHLORIDE 0.9 % IV BOLUS (SEPSIS)
1000.0000 mL | Freq: Once | INTRAVENOUS | Status: AC
  Administered 2016-03-15: 1000 mL via INTRAVENOUS

## 2016-03-15 NOTE — ED Triage Notes (Signed)
MIGRAINE HEADACHE FOR OVER A WEEK

## 2016-03-15 NOTE — ED Provider Notes (Signed)
[facility] Provider Note   CSN: [id number] Arrival date & time: 03/15/16  2012 By signing my name below, I, Levon Hedger, attest that this documentation has been prepared under the direction and in the presence of Mancel Bale, MD . Electronically Signed: Levon Hedger, Scribe. 03/15/2016. 10:59 PM.   History   Chief Complaint Chief Complaint  Patient presents with  . Migraine   HPI Diane [name] is a 35 y.o. female  who presents to the Emergency Department complaining of gradually worsening, constant headache that radiates to her neck onset one week ago. Per pt, her headache began as a frontal headache and has since spread diffusely throughout her head. She notes associated photophobia, nausea, phonophobia, fatigue, and nausea. She also notes associated lightheadedness which she describes as feeling near-syncope. Pt has taken tylenol and motrin with no relief. No alleviating factors noted. Pt was seen for the same on 03/09/16 where she was given benadryl and Reglan with some relief. Pt is a Production designer, theatre/television/film at [employer] and reports little stress at her job. She denies difficulty moving her arms, difficulty ambulating, fever, rhinorrhea, sore throat, cough, sneezing, or appetite change. She is not currently followed by a neurologist.   The history is provided by the patient. No language interpreter was used.   Past Medical History:  Diagnosis Date  . 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 UTI (lower urinary tract infection)     Patient Active Problem List   Diagnosis Date Noted  . Acute appendicitis 05/29/2015  . Poor posture 09/09/2013    Past Surgical History:  Procedure Laterality Date  . APPENDECTOMY    . LAPAROSCOPIC APPENDECTOMY N/A 05/29/2015   Procedure: APPENDECTOMY LAPAROSCOPIC;  Surgeon: Franky Macho, MD;  Location: [facility];  Service: General;  Laterality: N/A;  . TUBAL LIGATION  2012  . URETER REMOVAL      OB History    Gravida Para Term  Preterm AB Living   6 6 1 5   6    SAB TAB Ectopic Multiple Live Births                  Home Medications    Prior to Admission medications   Medication Sig Start Date End Date Taking? Authorizing Provider  acetaminophen (TYLENOL) 650 MG CR tablet Take 650 mg by mouth every 6 (six) hours as needed for pain.   Yes Historical Provider, MD  ibuprofen (ADVIL,MOTRIN) 200 MG tablet Take 600 mg by mouth every 6 (six) hours as needed for headache, mild pain or moderate pain.    Yes Historical Provider, MD    Family History Family History  Problem Relation Age of Onset  . Diabetes Mother   . Stroke Father   . Heart failure Father   . Diabetes Father   . Epilepsy Brother     Social History Social History  Substance Use Topics  . Smoking status: Never Smoker  . Smokeless tobacco: Never Used  . Alcohol use No    Allergies   Latex  Review of Systems Review of Systems  Constitutional: Positive for chills and fatigue. Negative for appetite change and fever.  HENT: Negative for rhinorrhea, sneezing and sore throat.   Eyes: Positive for photophobia.  Respiratory: Negative for cough.   Gastrointestinal: Positive for nausea.  Musculoskeletal: Positive for neck pain. Negative for gait problem.  Neurological: Positive for dizziness, light-headedness and headaches.  All other systems reviewed and are negative.  Physical Exam Updated Vital  Signs BP 144/78 (BP Location: Left Arm)   Pulse 67   Temp 98.3 F (36.8 C) (Oral)   Resp 18   Ht 5\' 4"  (1.626 m)   Wt 135 lb (61.2 kg)   LMP 02/23/2016   SpO2 100%   BMI 23.17 kg/m   Physical Exam  Constitutional: She is oriented to person, place, and time. She appears well-developed and well-nourished. No distress.  HENT:  Head: Normocephalic and atraumatic.  Eyes: Conjunctivae are normal.  Neck: Normal range of motion. Neck supple.  Cardiovascular: Normal rate and regular rhythm.   Pulmonary/Chest: Effort normal and breath sounds  normal.  Abdominal: She exhibits no distension.  Neurological: She is alert and oriented to person, place, and time.  No dysarthria and aphasia or nystagmus.  Skin: Skin is warm and dry.  Psychiatric: She has a normal mood and affect. Her behavior is normal. Judgment and thought content normal.  Nursing note and vitals reviewed.  ED Treatments / Results  DIAGNOSTIC STUDIES:  Oxygen Saturation is 100% on RA, normal by my interpretation.    COORDINATION OF CARE:  9:49 PM Will order compazine, decadron, and Toradol. Discussed treatment plan with pt at bedside and pt agreed to plan.  Labs (all labs ordered are listed, but only abnormal results are displayed) Labs Reviewed - No data to display  EKG  EKG Interpretation None      Radiology No results found.  Procedures Procedures (including critical care time)  Medications Ordered in ED Medications  prochlorperazine (COMPAZINE) injection 10 mg (10 mg Intravenous Given 03/15/16 2212)  dexamethasone (DECADRON) injection 10 mg (10 mg Intravenous Given 03/15/16 2208)  ketorolac (TORADOL) 30 MG/ML injection 30 mg (30 mg Intravenous Given 03/15/16 2207)  sodium chloride 0.9 % bolus 1,000 mL (1,000 mLs Intravenous New Bag/Given 03/15/16 2215)     Initial Impression / Assessment and Plan / ED Course  I have reviewed the triage vital signs and the nursing notes.  Pertinent labs & imaging results that were available during my care of the patient were reviewed by me and considered in my medical decision making (see chart for details).  Clinical Course   Medications  prochlorperazine (COMPAZINE) injection 10 mg (10 mg Intravenous Given 03/15/16 2212)  dexamethasone (DECADRON) injection 10 mg (10 mg Intravenous Given 03/15/16 2208)  ketorolac (TORADOL) 30 MG/ML injection 30 mg (30 mg Intravenous Given 03/15/16 2207)  sodium chloride 0.9 % bolus 1,000 mL (1,000 mLs Intravenous New Bag/Given 03/15/16 2215)    Patient Vitals for the  past 24 hrs:  BP Temp Temp src Pulse Resp SpO2 Height Weight  03/15/16 2034 - - - - - - 5\' 4"  (1.626 m) 135 lb (61.2 kg)  03/15/16 2033 144/78 98.3 F (36.8 C) Oral 67 18 100 % - -    11:23 PM Reevaluation with update and discussion. After initial assessment and treatment, an updated evaluation reveals She states her headache has resolved. She has no further complaints. Deshun Sedivy L    Final Clinical Impressions(s) / ED Diagnoses   Final diagnoses:  Nonintractable headache, unspecified chronicity pattern, unspecified headache type   Nonspecific ongoing headache for several weeks. Doubt meningitis, subarachnoid hemorrhage, serious bacterial infection or metabolic instability.  Nursing Notes Reviewed/ Care Coordinated Applicable Imaging Reviewed Interpretation of Laboratory Data incorporated into ED treatment  The patient appears reasonably screened and/or stabilized for discharge and I doubt any other medical condition or other [facility] requiring further screening, evaluation, or treatment in the ED at this time prior  to discharge.  Plan: Home Medications- OTC analgesia; Home Treatments- rest, fluids; return here if the recommended treatment, does not improve the symptoms; Recommended follow up- PCP and Neurology f/u prn    New Prescriptions New Prescriptions   No medications on file  I personally performed the services described in this documentation, which was scribed in my presence. The recorded information has been reviewed and is accurate.     Mancel Bale, MD 03/15/16 [phone]

## 2016-04-06 ENCOUNTER — Encounter (HOSPITAL_COMMUNITY): Payer: Self-pay | Admitting: *Deleted

## 2016-04-06 ENCOUNTER — Emergency Department (HOSPITAL_COMMUNITY)
Admission: EM | Admit: 2016-04-06 | Discharge: 2016-04-06 | Disposition: A | Discharge status: Home / Self Care | Payer: Medicaid Other | Attending: Emergency Medicine | Admitting: Emergency Medicine

## 2016-04-06 DIAGNOSIS — N898 Other specified noninflammatory disorders of vagina: Secondary | ICD-10-CM | POA: Insufficient documentation

## 2016-04-06 LAB — WET PREP, GENITAL
Sperm: NONE SEEN
Trich, Wet Prep: NONE SEEN
YEAST WET PREP: NONE SEEN

## 2016-04-06 LAB — I-STAT BETA HCG BLOOD, ED (MC, WL, AP ONLY): I-stat hCG, quantitative: <5 m[IU]/mL (ref <5)

## 2016-04-06 MED ORDER — AZITHROMYCIN 250 MG PO TABS
1000.0000 mg | ORAL_TABLET | Freq: Once | ORAL | Status: AC
  Administered 2016-04-06: 1000 mg via ORAL
  Filled 2016-04-06: qty 4

## 2016-04-06 MED ORDER — CEFTRIAXONE SODIUM 250 MG IJ SOLR
250.0000 mg | Freq: Once | INTRAMUSCULAR | Status: AC
  Administered 2016-04-06: 250 mg via INTRAMUSCULAR
  Filled 2016-04-06: qty 250

## 2016-04-06 MED ORDER — METRONIDAZOLE 500 MG PO TABS: 500.0000 mg | ORAL_TABLET | Freq: Two times a day (BID) | ORAL | 0 refills | Status: DC

## 2016-04-06 MED ORDER — LIDOCAINE HCL (PF) 1 % IJ SOLN
INTRAMUSCULAR | Status: AC
  Filled 2016-04-06: qty 5

## 2016-04-06 NOTE — Discharge Instructions (Signed)
Follow-up in 1-2 weeks with Dr. Despina HiddenEure for recheck

## 2016-04-06 NOTE — ED Triage Notes (Signed)
Pt c/o vaginal bleeding for 2 weeks and states today she noticed on her tampon some green malodorous discharge

## 2016-04-06 NOTE — ED Provider Notes (Signed)
[facility] Provider Note   CSN: [id number] Arrival date & time: 04/06/16  1951     History   Chief Complaint Chief Complaint  Patient presents with  . Vaginal Discharge    HPI Viviann Sparemanda J Keefe is a 35 y.o. female.  Patient states that she has some minor vaginal discharge and some right lower quadrant pain   The history is provided by the patient. No language interpreter was used.  Vaginal Discharge   This is a new problem. The current episode started more than 2 days ago. The problem occurs constantly. The problem has not changed since onset.The discharge occurs spontaneously. The discharge was yellow. Pertinent negatives include no anorexia, no abdominal pain, no diarrhea and no frequency. She has tried nothing for the symptoms. Her past medical history does not include irregular periods.    Past Medical History:  Diagnosis Date  . 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 UTI (lower urinary tract infection)     Patient Active Problem List   Diagnosis Date Noted  . Acute appendicitis 05/29/2015  . Poor posture 09/09/2013    Past Surgical History:  Procedure Laterality Date  . APPENDECTOMY    . LAPAROSCOPIC APPENDECTOMY N/A 05/29/2015   Procedure: APPENDECTOMY LAPAROSCOPIC;  Surgeon: Franky MachoMark Jenkins, MD;  Location: [facility];  Service: General;  Laterality: N/A;  . TUBAL LIGATION  2012  . URETER REMOVAL      OB History    Gravida Para Term Preterm AB Living   6 6 1 5   6    SAB TAB Ectopic Multiple Live Births                   Home Medications    Prior to Admission medications   Medication Sig Start Date End Date Taking? Authorizing Provider  metroNIDAZOLE (FLAGYL) 500 MG tablet Take 1 tablet (500 mg total) by mouth 2 (two) times daily. One po bid x 7 days 04/06/16   Bethann BerkshireJoseph Niyati Heinke, MD    Family History Family History  Problem Relation Age of Onset  . Diabetes Mother   . Stroke Father   . Heart failure Father   . Diabetes  Father   . Epilepsy Brother     Social History Social History  Substance Use Topics  . Smoking status: Never Smoker  . Smokeless tobacco: Never Used  . Alcohol use No     Allergies   Latex   Review of Systems Review of Systems  Constitutional: Negative for appetite change and fatigue.  HENT: Negative for congestion, ear discharge and sinus pressure.   Eyes: Negative for discharge.  Respiratory: Negative for cough.   Cardiovascular: Negative for chest pain.  Gastrointestinal: Negative for abdominal pain, anorexia and diarrhea.  Genitourinary: Positive for vaginal discharge. Negative for frequency and hematuria.  Musculoskeletal: Negative for back pain.  Skin: Negative for rash.  Neurological: Negative for seizures and headaches.  Psychiatric/Behavioral: Negative for hallucinations.     Physical Exam Updated Vital Signs BP 112/63   Pulse 63   Temp 98.2 F (36.8 C)   Resp 18   Ht 5\' 3"  (1.6 m)   Wt 146 lb (66.2 kg)   LMP 03/21/2016   SpO2 100%   BMI 25.86 kg/m   Physical Exam  Constitutional: She is oriented to person, place, and time. She appears well-developed.  HENT:  Head: Normocephalic.  Eyes: Conjunctivae and EOM are normal. No scleral icterus.  Neck: Neck supple. No thyromegaly  present.  Cardiovascular: Normal rate and regular rhythm.  Exam reveals no gallop and no friction rub.   No murmur heard. Pulmonary/Chest: No stridor. She has no wheezes. She has no rales. She exhibits no tenderness.  Abdominal: She exhibits no distension. There is no tenderness. There is no rebound.  Genitourinary: Vaginal discharge found.  Genitourinary Comments: Minimal vaginal discharge  Musculoskeletal: Normal range of motion. She exhibits no edema.  Lymphadenopathy:    She has no cervical adenopathy.  Neurological: She is oriented to person, place, and time. She exhibits normal muscle tone. Coordination normal.  Skin: No rash noted. No erythema.  Psychiatric: She has a  normal mood and affect. Her behavior is normal.     ED Treatments / Results  Labs (all labs ordered are listed, but only abnormal results are displayed) Labs Reviewed  WET PREP, GENITAL - Abnormal; Notable for the following:       Result Value   Clue Cells Wet Prep HPF POC PRESENT (*)    WBC, Wet Prep HPF POC MANY (*)    All other components within normal limits  I-STAT BETA HCG BLOOD, ED ([facility], [facility], [facility] ONLY)  GC/CHLAMYDIA PROBE AMP ([place]) NOT AT [facility]    EKG  EKG Interpretation None       Radiology No results found.  Procedures Procedures (including critical care time)  Medications Ordered in ED Medications  azithromycin (ZITHROMAX) tablet 1,000 mg (not administered)  cefTRIAXone (ROCEPHIN) injection 250 mg (not administered)  lidocaine (PF) (XYLOCAINE) 1 % injection (not administered)     Initial Impression / Assessment and Plan / ED Course  I have reviewed the triage vital signs and the nursing notes.  Pertinent labs & imaging results that were available during my care of the patient were reviewed by me and considered in my medical decision making (see chart for details).  Clinical Course     Patient's prior to test negative. Patient will be treated for S PAD. Wet prep positive for vaginosis. Patient put on Flagyl follow-up with OB/GYN  Final Clinical Impressions(s) / ED Diagnoses   Final diagnoses:  Vaginal discharge    New Prescriptions New Prescriptions   METRONIDAZOLE (FLAGYL) 500 MG TABLET    Take 1 tablet (500 mg total) by mouth 2 (two) times daily. One po bid x 7 days     Bethann BerkshireJoseph Maryam Feely, MD 04/06/16 2124

## 2016-04-10 LAB — GC/CHLAMYDIA PROBE AMP (~~LOC~~) NOT AT ARMC
CHLAMYDIA, DNA PROBE: NEGATIVE
Neisseria Gonorrhea: NEGATIVE

## 2016-08-10 ENCOUNTER — Encounter (HOSPITAL_COMMUNITY): Payer: Self-pay

## 2016-08-10 DIAGNOSIS — R1030 Lower abdominal pain, unspecified: Secondary | ICD-10-CM | POA: Diagnosis present

## 2016-08-10 DIAGNOSIS — K439 Ventral hernia without obstruction or gangrene: Secondary | ICD-10-CM | POA: Diagnosis not present

## 2016-08-10 NOTE — ED Triage Notes (Signed)
Pt reports pain and swelling to her lower right groin x 3 weeks that has worsened some since then.

## 2016-08-11 ENCOUNTER — Emergency Department (HOSPITAL_COMMUNITY)
Admission: EM | Admit: 2016-08-11 | Discharge: 2016-08-11 | Disposition: A | Discharge status: Home / Self Care | Payer: Managed Care, Other (non HMO) | Attending: Emergency Medicine | Admitting: Emergency Medicine

## 2016-08-11 DIAGNOSIS — K439 Ventral hernia without obstruction or gangrene: Secondary | ICD-10-CM

## 2016-08-11 NOTE — ED Provider Notes (Signed)
[facility] Provider Note   CSN: [id number] Arrival date & time: 08/10/16  2317     History   Chief Complaint Chief Complaint  Patient presents with  . Groin Swelling    HPI Diane [name] is a 36 y.o. female.  The history is provided by the patient.  Patient presents with "groin swelling" for past 3 weeks It is worsening over time It is worse with standing and improved with rest No fever/vomiting No dysuria No vaginal bleeding reported   Past Medical History:  Diagnosis Date  . 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 UTI (lower urinary tract infection)     Patient Active Problem List   Diagnosis Date Noted  . Acute appendicitis 05/29/2015  . Poor posture 09/09/2013    Past Surgical History:  Procedure Laterality Date  . APPENDECTOMY    . LAPAROSCOPIC APPENDECTOMY N/A 05/29/2015   Procedure: APPENDECTOMY LAPAROSCOPIC;  Surgeon: Franky Macho, MD;  Location: [facility];  Service: General;  Laterality: N/A;  . TUBAL LIGATION  2012  . URETER REMOVAL      OB History    Gravida Para Term Preterm AB Living   6 6 1 5   6    SAB TAB Ectopic Multiple Live Births                   Home Medications    Prior to Admission medications   Not on File    Family History Family History  Problem Relation Age of Onset  . Diabetes Mother   . Stroke Father   . Heart failure Father   . Diabetes Father   . Epilepsy Brother     Social History Social History  Substance Use Topics  . Smoking status: Never Smoker  . Smokeless tobacco: Never Used  . Alcohol use No     Allergies   Latex   Review of Systems Review of Systems  Constitutional: Negative for fever.  Gastrointestinal: Negative for vomiting.  Genitourinary: Negative for dysuria, vaginal bleeding and vaginal discharge.  Musculoskeletal: Negative for back pain.  All other systems reviewed and are negative.    Physical Exam Updated Vital Signs BP 124/80   Pulse 67    Temp 98.4 F (36.9 C) (Oral)   Resp 18   Ht 5\' 3"  (1.6 m)   Wt 63.5 kg   LMP 07/20/2016   SpO2 97%   BMI 24.80 kg/m   Physical Exam CONSTITUTIONAL: Well developed/well nourished HEAD: Normocephalic/atraumatic EYES: EOMI/PERRL ENMT: Mucous membranes moist NECK: supple no meningeal signs SPINE/BACK:entire spine nontender CV: S1/S2 noted, no murmurs/rubs/gallops noted LUNGS: Lungs are clear to auscultation bilaterally, no apparent distress ABDOMEN: soft, nontender, no rebound or guarding, bowel sounds noted throughout abdomen.  She has small lower abdominal wall hernia that is easily reducible that is nontender and without skin color change GU:no cva tenderness, no edema/erythema noted to vulvar region, female nurse Wilkie Aye present for exam.  Husband present at patient request NEURO: Pt is awake/alert/appropriate, moves all extremitiesx4.  No facial droop.   EXTREMITIES: pulses normal/equal, full ROM SKIN: warm, color normal PSYCH: no abnormalities of mood noted, alert and oriented to situation   ED Treatments / Results  Labs (all labs ordered are listed, but only abnormal results are displayed) Labs Reviewed - No data to display  EKG  EKG Interpretation None       Radiology No results found.  Procedures Procedures (including critical care time)  Medications Ordered  in ED Medications - No data to display   Initial Impression / Assessment and Plan / ED Course  I have reviewed the triage vital signs and the nursing notes.      Pt with lower abdominal wall hernia No recent heavy lifting and she is a nonsmoker but has had previous ABD surgery  Referred to general surgery We discussed strict ER return precautions   Final Clinical Impressions(s) / ED Diagnoses   Final diagnoses:  Abdominal wall hernia    New Prescriptions Discharge Medication List as of 08/11/2016  3:08 AM       Zadie Rhineonald Tzippy Testerman, MD 08/11/16 0500

## 2016-09-05 ENCOUNTER — Ambulatory Visit: Payer: Self-pay | Admitting: General Surgery

## 2017-02-13 IMAGING — CT CT HEAD W/O CM
3 series · 16 of 44 positions shown, 19 images · non-contrast
Comparison: None.

CLINICAL DATA: Initial evaluation for acute headache.

EXAM:
CT HEAD WITHOUT CONTRAST
TECHNIQUE: Contiguous axial images were obtained from the base of the skull
through the vertex without intravenous contrast.

[Series 2: head trauma wo · axial · 0.41mm/px · z∈[+154,+264]mm · 10 of 27 slices shown, 13 images]
[im 3/27  brain]
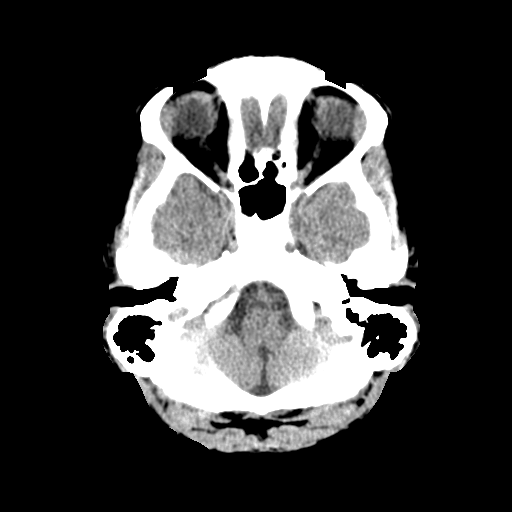
[im 3/27  bone]
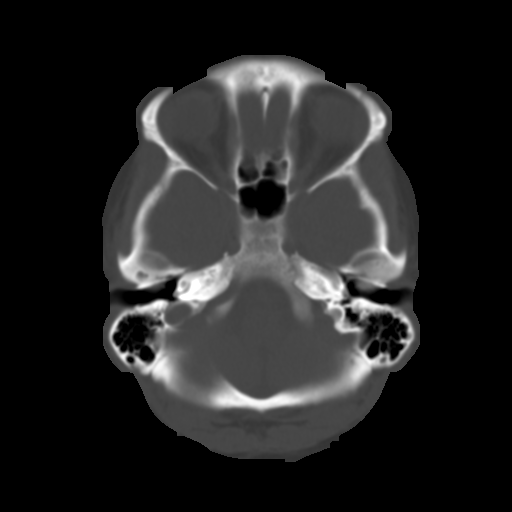
[im 5/27  brain]
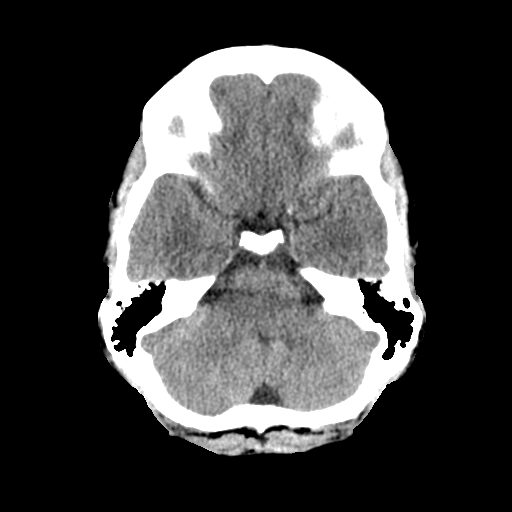
[im 8/27  brain]
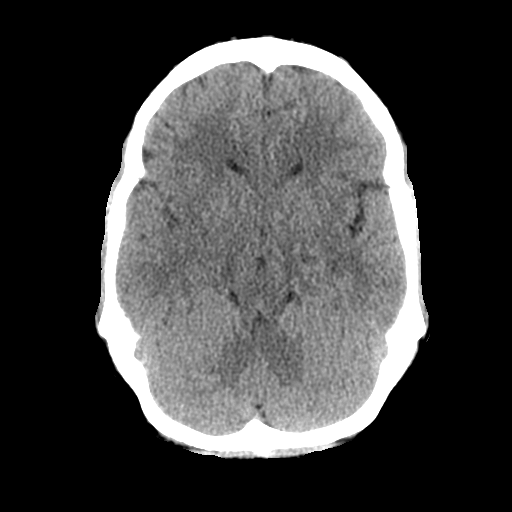
[im 10/27  brain]
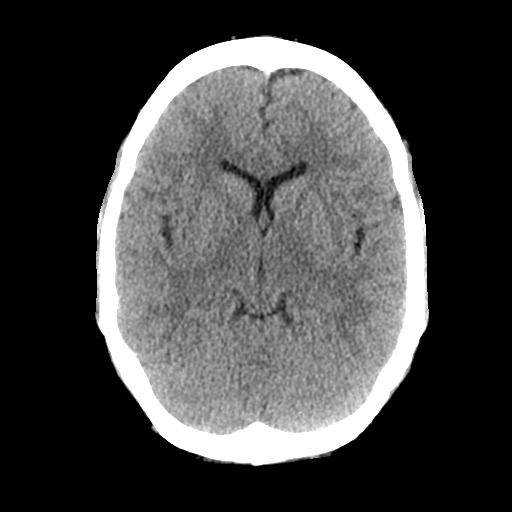
[im 13/27  brain]
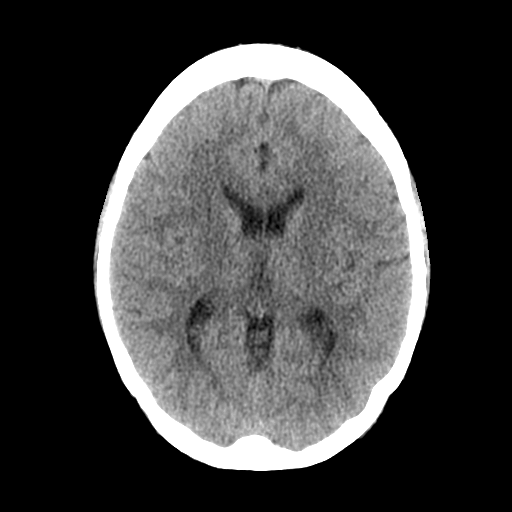
[im 13/27  bone]
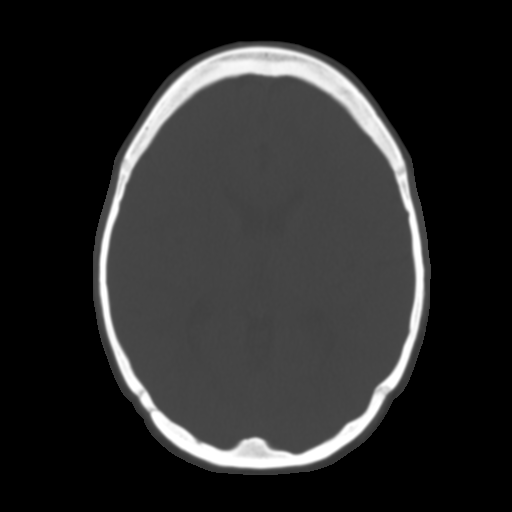
[im 15/27  brain]
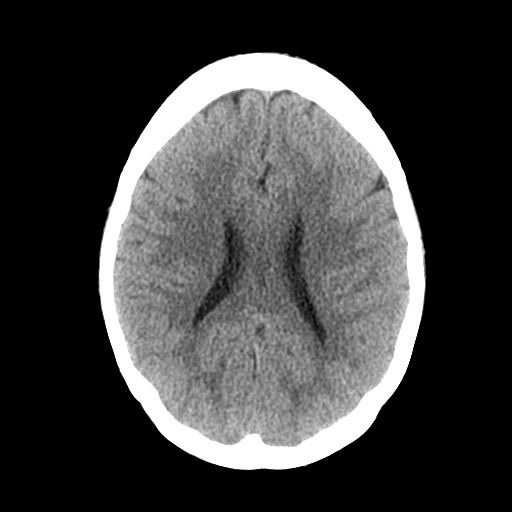
[im 18/27  brain]
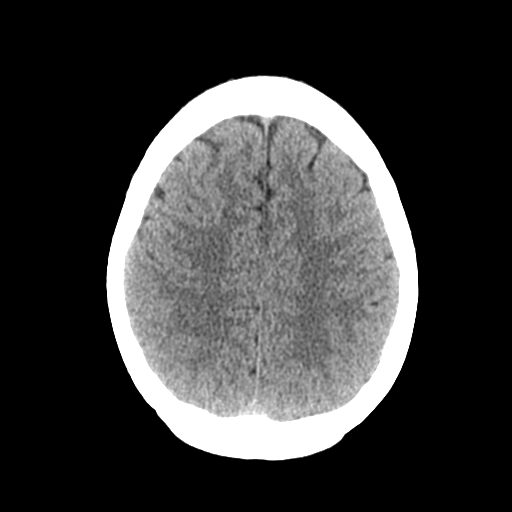
[im 20/27  brain]
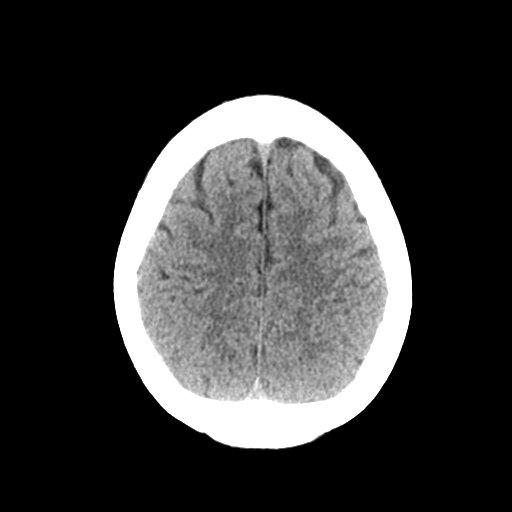
[im 23/27  brain]
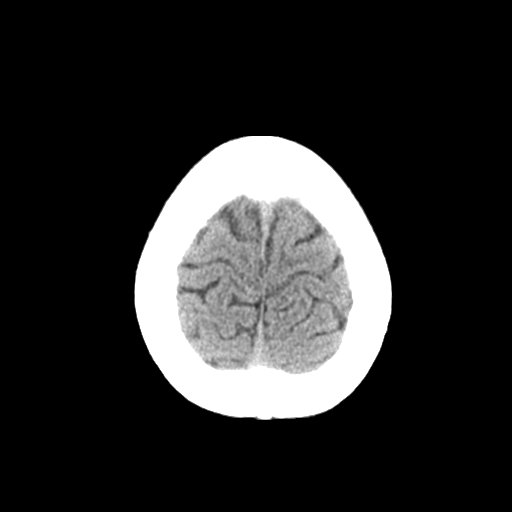
[im 23/27  bone]
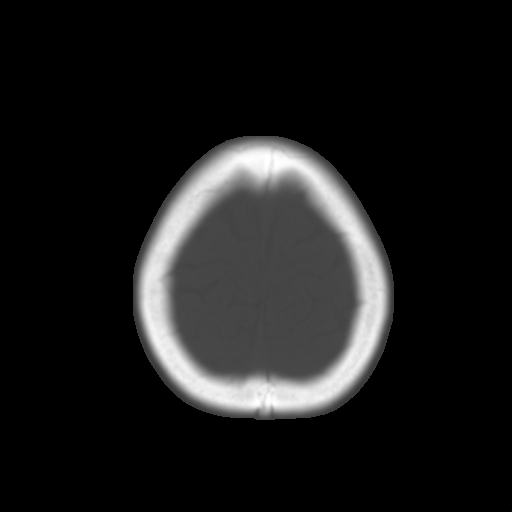
[im 25/27  brain]
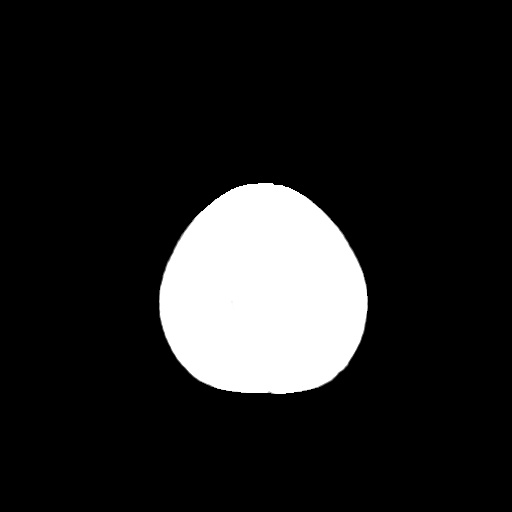

[Series 4: coronal soft tissue · coronal · 0.31mm/px · 3 of 62 slices shown]
[im 21/62  brain]
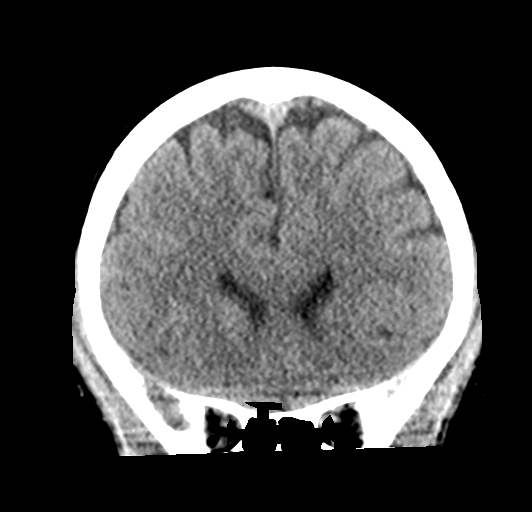
[im 28/62  brain]
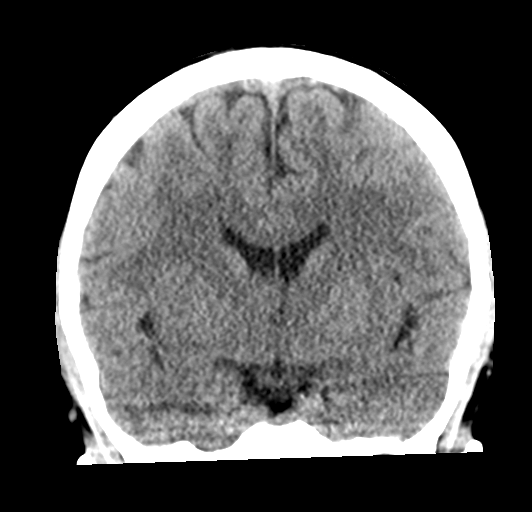
[im 34/62  brain]
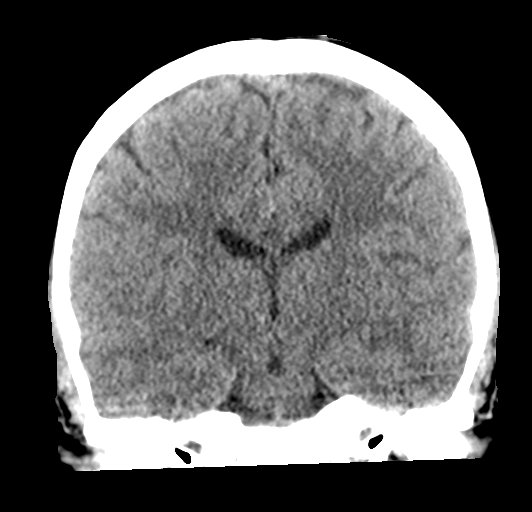

[Series 5: sagittal soft tissue · sagittal · 0.30mm/px · 3 of 52 slices shown]
[im 18/52  brain]
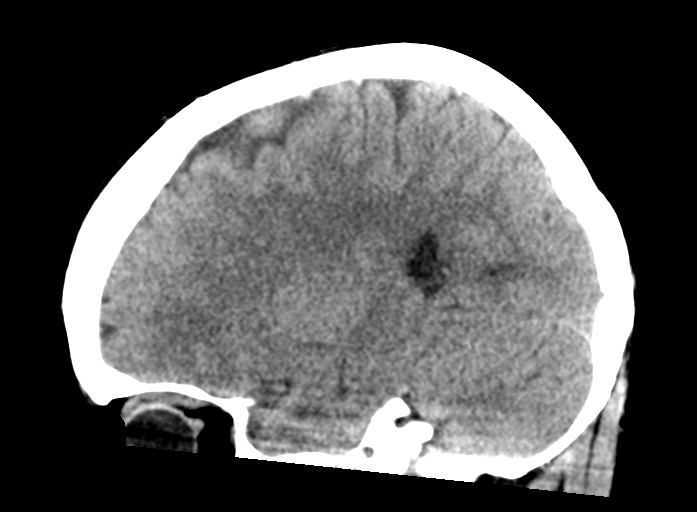
[im 26/52  brain]
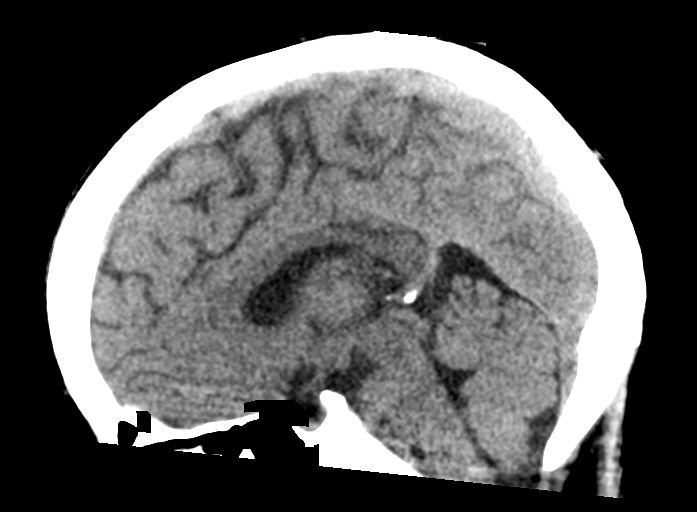
[im 35/52  brain]
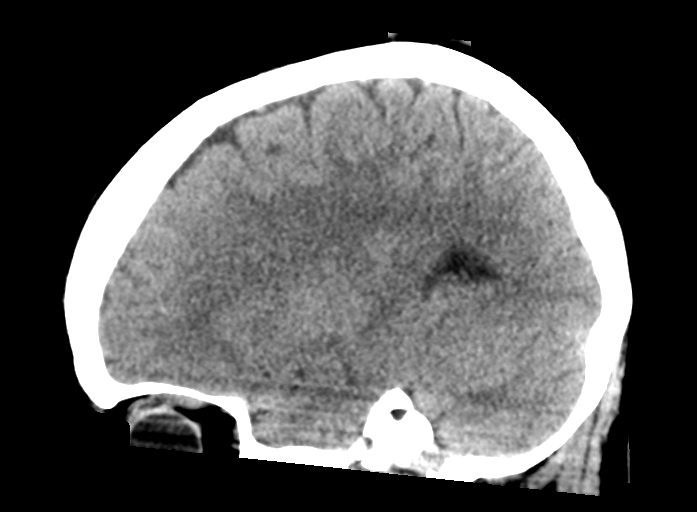

[16 of 44 positions shown; findings below may reference images not displayed]

FINDINGS: Brain: Cerebral volume normal. Gray-white matter differentiation
well maintained. No acute intracranial hemorrhage. No evidence for
acute large vessel territory infarct. No mass lesion, midline shift
or mass effect. No hydrocephalus. No extra-axial fluid collection.

Vascular: No hyperdense vessel.

Skull: Scalp soft tissues within normal limits.  Calvarium intact.

Sinuses/Orbits: Globes and orbital soft tissues normal. Visualized
paranasal sinuses and mastoid air cells are clear.
IMPRESSION: Normal head CT.  No acute intracranial process identified.

## 2017-04-25 ENCOUNTER — Emergency Department (HOSPITAL_COMMUNITY)
Admission: EM | Admit: 2017-04-25 | Discharge: 2017-04-25 | Disposition: A | Discharge status: Home / Self Care | Payer: Managed Care, Other (non HMO) | Attending: Emergency Medicine | Admitting: Emergency Medicine

## 2017-04-25 ENCOUNTER — Encounter (HOSPITAL_COMMUNITY): Payer: Self-pay | Admitting: Emergency Medicine

## 2017-04-25 ENCOUNTER — Other Ambulatory Visit: Payer: Self-pay

## 2017-04-25 ENCOUNTER — Emergency Department (HOSPITAL_COMMUNITY): Payer: Managed Care, Other (non HMO)

## 2017-04-25 DIAGNOSIS — Z79899 Other long term (current) drug therapy: Secondary | ICD-10-CM | POA: Diagnosis not present

## 2017-04-25 DIAGNOSIS — K429 Umbilical hernia without obstruction or gangrene: Secondary | ICD-10-CM

## 2017-04-25 DIAGNOSIS — Z91040 Latex allergy status: Secondary | ICD-10-CM | POA: Insufficient documentation

## 2017-04-25 LAB — COMPREHENSIVE METABOLIC PANEL
ALT: 11 U/L — ABNORMAL LOW (ref 14–54)
AST: 13 U/L — AB (ref 15–41)
Albumin: 4.1 g/dL (ref 3.5–5.0)
Alkaline Phosphatase: 40 U/L (ref 38–126)
Anion gap: 6 (ref 5–15)
BILIRUBIN TOTAL: 0.8 mg/dL (ref 0.3–1.2)
BUN: 16 mg/dL (ref 6–20)
CALCIUM: 8.9 mg/dL (ref 8.9–10.3)
CO2: 24 mmol/L (ref 22–32)
CREATININE: 0.88 mg/dL (ref 0.44–1.00)
Chloride: 106 mmol/L (ref 101–111)
GFR calc Af Amer: >60 mL/min (ref >60)
Glucose, Bld: 82 mg/dL (ref 65–99)
POTASSIUM: 4 mmol/L (ref 3.5–5.1)
Sodium: 136 mmol/L (ref 135–145)
TOTAL PROTEIN: 7.3 g/dL (ref 6.5–8.1)

## 2017-04-25 LAB — CBC WITH DIFFERENTIAL/PLATELET
BASOS ABS: 0 10*3/uL (ref 0.0–0.1)
Basophils Relative: 0 %
Eosinophils Absolute: 0.1 10*3/uL (ref 0.0–0.7)
Eosinophils Relative: 2 %
HEMATOCRIT: 38.4 % (ref 36.0–46.0)
Hemoglobin: 12.5 g/dL (ref 12.0–15.0)
LYMPHS PCT: 26 %
Lymphs Abs: 1.6 10*3/uL (ref 0.7–4.0)
MCH: 29.8 pg (ref 26.0–34.0)
MCHC: 32.6 g/dL (ref 30.0–36.0)
MCV: 91.4 fL (ref 78.0–100.0)
MONO ABS: 0.3 10*3/uL (ref 0.1–1.0)
MONOS PCT: 5 %
NEUTROS ABS: 4.2 10*3/uL (ref 1.7–7.7)
Neutrophils Relative %: 67 %
Platelets: 224 10*3/uL (ref 150–400)
RBC: 4.2 MIL/uL (ref 3.87–5.11)
RDW: 12.6 % (ref 11.5–15.5)
WBC: 6.3 10*3/uL (ref 4.0–10.5)

## 2017-04-25 LAB — PREGNANCY, URINE: PREG TEST UR: NEGATIVE

## 2017-04-25 LAB — LIPASE, BLOOD: LIPASE: 46 U/L (ref 11–51)

## 2017-04-25 LAB — URINALYSIS, ROUTINE W REFLEX MICROSCOPIC
BILIRUBIN URINE: NEGATIVE
Glucose, UA: NEGATIVE mg/dL
Hgb urine dipstick: NEGATIVE
KETONES UR: NEGATIVE mg/dL
Leukocytes, UA: NEGATIVE
NITRITE: NEGATIVE
PROTEIN: NEGATIVE mg/dL
Specific Gravity, Urine: 1.011 (ref 1.005–1.030)
pH: 5 (ref 5.0–8.0)

## 2017-04-25 MED ORDER — SODIUM CHLORIDE 0.9 % IV BOLUS (SEPSIS)
1000.0000 mL | Freq: Once | INTRAVENOUS | Status: AC
  Administered 2017-04-25: 1000 mL via INTRAVENOUS

## 2017-04-25 MED ORDER — ONDANSETRON 4 MG PO TBDP: 4.0000 mg | ORAL_TABLET | Freq: Three times a day (TID) | ORAL | 0 refills | Status: AC | PRN

## 2017-04-25 MED ORDER — IBUPROFEN 600 MG PO TABS: 600.0000 mg | ORAL_TABLET | Freq: Four times a day (QID) | ORAL | 0 refills | Status: AC | PRN

## 2017-04-25 MED ORDER — ONDANSETRON HCL 4 MG/2ML IJ SOLN
4.0000 mg | Freq: Once | INTRAMUSCULAR | Status: AC
  Administered 2017-04-25: 4 mg via INTRAVENOUS
  Filled 2017-04-25: qty 2

## 2017-04-25 NOTE — ED Provider Notes (Signed)
[facility]ENCY [facility]PARTMENT Provider Note   CSN: [id number] Arrival date & time: 04/25/17  1008     History   Chief Complaint Chief Complaint  Patient presents with  . Hernia    HPI Viviann Sparemanda J Welsch is a 36 y.o. female.  Pt presents to the ED today with a painful mass above her belly button.  Pt has a hx of a right inguinal hernia repaired early this year in [place] and is concerned she has another hernia.  The pt said she first noticed it about 2 months ago.  She said it has gotten bigger and painful.  The pt now has some nausea and weakness.      Past Medical History:  Diagnosis Date  . 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 UTI (lower urinary tract infection)     Patient Active Problem List   Diagnosis Date Noted  . Acute appendicitis 05/29/2015  . Poor posture 09/09/2013    Past Surgical History:  Procedure Laterality Date  . APPENDECTOMY    . HERNIA REPAIR    . LAPAROSCOPIC APPENDECTOMY N/A 05/29/2015   Procedure: APPENDECTOMY LAPAROSCOPIC;  Surgeon: Franky MachoMark Jenkins, MD;  Location: [facility];  Service: General;  Laterality: N/A;  . TUBAL LIGATION  2012  . URETER REMOVAL      OB History    Gravida Para Term Preterm AB Living   6 6 1 5   6    SAB TAB Ectopic Multiple Live Births                   Home Medications    Prior to Admission medications   Medication Sig Start Date End Date Taking? Authorizing Provider  ferrous sulfate 325 (65 FE) MG EC tablet Take 325 mg by mouth daily with breakfast.   Yes [provider]  Multiple Vitamins-Calcium (ONE-A-DAY WOMENS PO) Take 1 tablet by mouth daily.   Yes [provider]  ibuprofen (ADVIL,MOTRIN) 600 MG tablet Take 1 tablet (600 mg total) by mouth every 6 (six) hours as needed. 04/25/17   Jacalyn LefevreHaviland, Arelly Whittenberg, MD  ondansetron (ZOFRAN ODT) 4 MG disintegrating tablet Take 1 tablet (4 mg total) by mouth every 8 (eight) hours as needed. 04/25/17   Jacalyn LefevreHaviland, Shields Pautz, MD    Family  History Family History  Problem Relation Age of Onset  . Diabetes Mother   . Stroke Father   . Heart failure Father   . Diabetes Father   . Epilepsy Brother     Social History Social History   Tobacco Use  . Smoking status: Never Smoker  . Smokeless tobacco: Never Used  Substance Use Topics  . Alcohol use: No  . Drug use: No     Allergies   Latex   Review of Systems Review of Systems  Gastrointestinal: Positive for abdominal pain and nausea.  All other systems reviewed and are negative.    Physical Exam Updated Vital Signs BP 122/75   Pulse 75   Temp 98.6 F (37 C)   Resp 16   Ht 5\' 3"  (1.6 m)   Wt 63.5 kg (140 lb)   LMP 04/01/2017 Comment: neg preg test   SpO2 100%   BMI 24.80 kg/m   Physical Exam  Constitutional: She is oriented to person, place, and time. She appears well-developed and well-nourished.  HENT:  Head: Normocephalic and atraumatic.  Right Ear: External ear normal.  Left Ear: External ear normal.  Nose: Nose normal.  Mouth/Throat: Oropharynx is clear and moist.  Eyes: Conjunctivae and EOM are normal. Pupils are equal, round, and reactive to light.  Neck: Normal range of motion. Neck supple.  Cardiovascular: Normal rate, regular rhythm, normal heart sounds and intact distal pulses.  Pulmonary/Chest: Effort normal and breath sounds normal.  Abdominal: Soft. Bowel sounds are normal.    Musculoskeletal: Normal range of motion.  Neurological: She is alert and oriented to person, place, and time.  Skin: Skin is warm. Capillary refill takes less than 2 seconds.  Psychiatric: She has a normal mood and affect. Her behavior is normal. Judgment and thought content normal.  Nursing note and vitals reviewed.    ED Treatments / Results  Labs (all labs ordered are listed, but only abnormal results are displayed) Labs Reviewed  COMPREHENSIVE METABOLIC PANEL - Abnormal; Notable for the following components:      Result Value   AST 13 (*)     ALT 11 (*)    All other components within normal limits  CBC WITH DIFFERENTIAL/PLATELET  LIPASE, BLOOD  URINALYSIS, ROUTINE W REFLEX MICROSCOPIC  PREGNANCY, URINE    EKG  EKG Interpretation None       Radiology Dg Abd Acute W/chest  Result Date: 04/25/2017 CLINICAL DATA:  Pain just above unable EXAM: DG ABDOMEN ACUTE W/ 1V CHEST COMPARISON:  08/21/16 FINDINGS: There is no evidence of dilated bowel loops or free intraperitoneal air. No radiopaque calculi or other significant radiographic abnormality is seen. Heart size and mediastinal contours are within normal limits. Both lungs are clear. IMPRESSION: Negative abdominal radiographs.  No acute cardiopulmonary disease. Electronically Signed   By: Signa Kellaylor  Stroud M.D.   On: 04/25/2017 11:42    Procedures Procedures (including critical care time)  Medications Ordered in ED Medications  sodium chloride 0.9 % bolus 1,000 mL (0 mLs Intravenous Stopped 04/25/17 1216)  ondansetron (ZOFRAN) injection 4 mg (4 mg Intravenous Given 04/25/17 1049)     Initial Impression / Assessment and Plan / ED Course  I have reviewed the triage vital signs and the nursing notes.  Pertinent labs & imaging results that were available during my care of the patient were reviewed by me and considered in my medical decision making (see chart for details).    Pt is feeling better.  Hernia is not incarcerated or irreducible.  Pt is stable for d/c.  F/u with surgery.  Final Clinical Impressions(s) / ED Diagnoses   Final diagnoses:  Umbilical hernia without obstruction and without gangrene    ED Discharge Orders        Ordered    ondansetron (ZOFRAN ODT) 4 MG disintegrating tablet  Every 8 hours PRN     04/25/17 1214    ibuprofen (ADVIL,MOTRIN) 600 MG tablet  Every 6 hours PRN     04/25/17 1214       Jacalyn LefevreHaviland, Jansen Goodpasture, MD 04/25/17 1220

## 2017-04-25 NOTE — ED Triage Notes (Signed)
Pt c/o pain and bulging to upper mid abdomen. Some nausea.

## 2017-05-08 ENCOUNTER — Ambulatory Visit (INDEPENDENT_AMBULATORY_CARE_PROVIDER_SITE_OTHER): Payer: Managed Care, Other (non HMO) | Admitting: General Surgery

## 2017-05-08 ENCOUNTER — Encounter: Payer: Self-pay | Admitting: General Surgery

## 2017-05-08 VITALS — BP 117/76 | HR 66 | Temp 97.8°F | Resp 18 | Ht 63.0 in | Wt 141.0 lb

## 2017-05-08 DIAGNOSIS — K429 Umbilical hernia without obstruction or gangrene: Secondary | ICD-10-CM

## 2017-05-08 NOTE — H&P (Signed)
[facility] History and Physical  Reason for Referral: Umbilical Hernia  Referring Physician:  ED [place]  Chief Complaint    Umbilical Hernia      Diane [name] is a 36 y.o. female.  HPI: Diane [name] is a 36 yo active female with a new umbilical hernia that she noticed starting in August. The hernia she reports occurs after a long day of work, and is hard and unable to be reduced at times. She reports that she has a significant amount of pain when it is stuck out, and that it reduces after some period of time. She has had some nausea but no vomiting, and continues to have BMs. She works at [employer] and does lift some 5 galloon jugs of tea and is active. She has undergone a laparoscopic appendectomy as well as a laparoscopic tubal ligation both of which were through the umbilical site.  She also had a right inguinal hernia repair recently.   Past Medical History:  Diagnosis Date  . Duplicated ureter, left   . 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 UTI (lower urinary tract infection)     Past Surgical History:  Procedure Laterality Date  . APPENDECTOMY    . HERNIA REPAIR     right inguinal hernia  . LAPAROSCOPIC APPENDECTOMY N/A 05/29/2015   Procedure: APPENDECTOMY LAPAROSCOPIC;  Surgeon: Mark Jenkins, MD;  Location: [facility];  Service: General;  Laterality: N/A;  . TUBAL LIGATION  2012  . URETER REMOVAL     Reports surgery as infant on duplicated ureters bilaterally, and injury to the right kidney causing dysfunction of that kidney, and continued left ureter duplication as adult    Family History  Problem Relation Age of Onset  . Diabetes Mother   . Stroke Father   . Heart failure Father   . Diabetes Father   . Epilepsy Brother     Social History   Tobacco Use  . Smoking status: Never Smoker  . Smokeless tobacco: Never Used    Substance Use Topics  . Alcohol use: No  . Drug use: No    Medications: I have reviewed the patient's current medications. Allergies as of 05/08/2017      Reactions   Latex Rash      Medication List        Accurate as of 05/08/17 10:39 AM. Always use your most recent med list.          ferrous sulfate 325 (65 FE) MG EC tablet Take 325 mg by mouth daily with breakfast.   ibuprofen 600 MG tablet Commonly known as:  ADVIL,MOTRIN Take 1 tablet (600 mg total) by mouth every 6 (six) hours as needed.   ondansetron 4 MG disintegrating tablet Commonly known as:  ZOFRAN ODT Take 1 tablet (4 mg total) by mouth every 8 (eight) hours as needed.   ONE-A-DAY WOMENS PO Take 1 tablet by mouth daily.        ROS:  A comprehensive review of systems was negative except for: Cardiovascular: positive for varicose veins Gastrointestinal: positive for abdominal pain/ discomfort at umbilical site Neurological: positive for headaches  Blood pressure 117/76, pulse 66, temperature 97.8 F (36.6 C), resp. rate 18, height 5' 3" (1.6 m), weight 141 lb (64 kg). Physical Exam  Constitutional: She is oriented to person, place, and time and well-developed, well-nourished,   and in no distress.  HENT:  Head: Normocephalic.  Eyes: Pupils are equal, round, and reactive to light.  Neck: Normal range of motion.  Cardiovascular: Normal rate and regular rhythm.  Pulmonary/Chest: Effort normal and breath sounds normal.  Abdominal: Soft. She exhibits no distension. There is no tenderness.  Port site scars above and below the umbilicus, small defect at umbilicus with reducible hernia  Musculoskeletal: Normal range of motion. She exhibits no edema.  Neurological: She is alert and oriented to person, place, and time.  Skin: Skin is warm and dry.  Psychiatric: Mood, memory, affect and judgment normal.  Vitals reviewed.   Results: None  Assessment & Plan:  Diane [name] is a 36 y.o. female with  an umbilical hernia that is causing some discomfort and at times sounds like it is difficult to reduce. She is active and works. She would like to proceed with getting it repaired.    -Plan for open umbilical hernia repair with mesh 12/28    All questions were answered to the satisfaction of the patient.  The risk and benefits of open umbilical hernia repair with mesh were discussed including but not limited to infection, bleeding, injury to bowel, risk of mesh infection.  After careful consideration, Diane [name] has decided to proceed.    Diane [name] 05/08/2017, 10:39 AM       

## 2017-05-08 NOTE — Progress Notes (Signed)
[facility] History and Physical  Reason for Referral: Umbilical Hernia  Referring Physician:  ED [facility]  Chief Complaint    Umbilical Hernia      Viviann Sparemanda J Villalon is a 36 y.o. female.  HPI: Ms. Wallie RenshawWhisenhunt is a 36 yo active female with a new umbilical hernia that she noticed starting in August. The hernia she reports occurs after a long day of work, and is hard and unable to be reduced at times. She reports that she has a significant amount of pain when it is stuck out, and that it reduces after some period of time. She has had some nausea but no vomiting, and continues to have BMs. She works at [employer] and does lift some 5 galloon jugs of tea and is active. She has undergone a laparoscopic appendectomy as well as a laparoscopic tubal ligation both of which were through the umbilical site.  She also had a right inguinal hernia repair recently.   Past Medical History:  Diagnosis Date  . Duplicated ureter, left   . 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 UTI (lower urinary tract infection)     Past Surgical History:  Procedure Laterality Date  . APPENDECTOMY    . HERNIA REPAIR     right inguinal hernia  . LAPAROSCOPIC APPENDECTOMY N/A 05/29/2015   Procedure: APPENDECTOMY LAPAROSCOPIC;  Surgeon: Franky MachoMark Jenkins, MD;  Location: [facility];  Service: General;  Laterality: N/A;  . TUBAL LIGATION  2012  . URETER REMOVAL     Reports surgery as infant on duplicated ureters bilaterally, and injury to the right kidney causing dysfunction of that kidney, and continued left ureter duplication as adult    Family History  Problem Relation Age of Onset  . Diabetes Mother   . Stroke Father   . Heart failure Father   . Diabetes Father   . Epilepsy Brother     Social History   Tobacco Use  . Smoking status: Never Smoker  . Smokeless tobacco: Never Used    Substance Use Topics  . Alcohol use: No  . Drug use: No    Medications: I have reviewed the patient's current medications. Allergies as of 05/08/2017      Reactions   Latex Rash      Medication List        Accurate as of 05/08/17 10:39 AM. Always use your most recent med list.          ferrous sulfate 325 (65 FE) MG EC tablet Take 325 mg by mouth daily with breakfast.   ibuprofen 600 MG tablet Commonly known as:  ADVIL,MOTRIN Take 1 tablet (600 mg total) by mouth every 6 (six) hours as needed.   ondansetron 4 MG disintegrating tablet Commonly known as:  ZOFRAN ODT Take 1 tablet (4 mg total) by mouth every 8 (eight) hours as needed.   ONE-A-DAY WOMENS PO Take 1 tablet by mouth daily.        ROS:  A comprehensive review of systems was negative except for: Cardiovascular: positive for varicose veins Gastrointestinal: positive for abdominal pain/ discomfort at umbilical site Neurological: positive for headaches  Blood pressure 117/76, pulse 66, temperature 97.8 F (36.6 C), resp. rate 18, height 5\' 3"  (1.6 m), weight 141 lb (64 kg). Physical Exam  Constitutional: She is oriented to person, place, and time and well-developed, well-nourished,  and in no distress.  HENT:  Head: Normocephalic.  Eyes: Pupils are equal, round, and reactive to light.  Neck: Normal range of motion.  Cardiovascular: Normal rate and regular rhythm.  Pulmonary/Chest: Effort normal and breath sounds normal.  Abdominal: Soft. She exhibits no distension. There is no tenderness.  Port site scars above and below the umbilicus, small defect at umbilicus with reducible hernia  Musculoskeletal: Normal range of motion. She exhibits no edema.  Neurological: She is alert and oriented to person, place, and time.  Skin: Skin is warm and dry.  Psychiatric: Mood, memory, affect and judgment normal.  Vitals reviewed.   Results: None  Assessment & Plan:  Viviann Sparemanda J Cooks is a 36 y.o. female with  an umbilical hernia that is causing some discomfort and at times sounds like it is difficult to reduce. She is active and works. She would like to proceed with getting it repaired.    -Plan for open umbilical hernia repair with mesh 12/28    All questions were answered to the satisfaction of the patient.  The risk and benefits of open umbilical hernia repair with mesh were discussed including but not limited to infection, bleeding, injury to bowel, risk of mesh infection.  After careful consideration, Viviann Sparemanda J Thain has decided to proceed.    Lucretia RoersLindsay C Vikas Wegmann 05/08/2017, 10:39 AM

## 2017-05-11 NOTE — Patient Instructions (Signed)
Diane [name]  05/11/2017     @PREFPERIOPPHARMACY @   Your procedure is scheduled on  05/18/2017   Report to [name] at  615   A.M.  Call this number if you have problems the morning of surgery:  [phone]   Remember:  Do not eat food or drink liquids after midnight.  Take these medicines the morning of surgery with A SIP OF WATER  zofran   Do not wear jewelry, make-up or nail polish.  Do not wear lotions, powders, or perfumes, or deodorant.  Do not shave 48 hours prior to surgery.  Men may shave face and neck.  Do not bring valuables to the hospital.  [name] is not responsible for any belongings or valuables.  Contacts, dentures or bridgework may not be worn into surgery.  Leave your suitcase in the car.  After surgery it may be brought to your room.  For patients admitted to the hospital, discharge time will be determined by your treatment team.  Patients discharged the day of surgery will not be allowed to drive home.   Name and phone number of your driver:   family Special instructions:  None  Please read over the following fact sheets that you were given. Anesthesia Post-op Instructions and Care and Recovery After Surgery       Open Hernia Repair, Adult Open hernia repair is a surgical procedure to fix a hernia. A hernia occurs when an internal organ or tissue pushes out through a weak spot in the abdominal wall muscles. Hernias commonly occur in the groin and around the navel. Most hernias tend to get worse over time. Often, surgery is done to prevent the hernia from becoming bigger, uncomfortable, or an emergency. Emergency surgery may be needed if abdominal contents get stuck in the opening (incarcerated hernia) or the blood supply gets cut off (strangulated hernia). In an open repair, an incision is made in the abdomen to perform the surgery. Tell a health care provider about:  Any allergies you have.  All medicines you are  taking, including vitamins, herbs, eye drops, creams, and over-the-counter medicines.  Any problems you or family members have had with anesthetic medicines.  Any blood or bone disorders you have.  Any surgeries you have had.  Any medical conditions you have, including any recent cold or flu symptoms.  Whether you are pregnant or may be pregnant. What are the risks? Generally, this is a safe procedure. However, problems may occur, including:  Long-lasting (chronic) pain.  Bleeding.  Infection.  Damage to the testicle. This can cause shrinking or swelling.  Damage to the bladder, blood vessels, intestine, or nerves near the hernia.  Trouble passing urine.  Allergic reactions to medicines.  Return of the hernia.  What happens before the procedure? Staying hydrated Follow instructions from your health care provider about hydration, which may include:  Up to 2 hours before the procedure - you may continue to drink clear liquids, such as water, clear fruit juice, black coffee, and plain tea.  Eating and drinking restrictions Follow instructions from your health care provider about eating and drinking, which may include:  8 hours before the procedure - stop eating heavy meals or foods such as meat, fried foods, or fatty foods.  6 hours before the procedure - stop eating light meals or foods, such as toast or cereal.  6 hours before the procedure - stop drinking milk or drinks that  contain milk.  2 hours before the procedure - stop drinking clear liquids.  Medicines  Ask your health care provider about: ? Changing or stopping your regular medicines. This is especially important if you are taking diabetes medicines or blood thinners. ? Taking medicines such as aspirin and ibuprofen. These medicines can thin your blood. Do not take these medicines before your procedure if your health care provider instructs you not to.  You may be given antibiotic medicine to help prevent  infection. General instructions  You may have blood tests or imaging studies.  Ask your health care provider how your surgical site will be marked or identified.  If you smoke, do not smoke for at least 2 weeks before your procedure or for as long as told by your health care provider.  Let your health care provider know if you develop a cold or any infection before your surgery.  Plan to have someone take you home from the hospital or clinic.  If you will be going home right after the procedure, plan to have someone with you for 24 hours. What happens during the procedure?  To reduce your risk of infection: ? Your health care team will wash or sanitize their hands. ? Your skin will be washed with soap. ? Hair may be removed from the surgical area.  An IV tube will be inserted into one of your veins.  You will be given one or more of the following: ? A medicine to help you relax (sedative). ? A medicine to numb the area (local anesthetic). ? A medicine to make you fall asleep (general anesthetic).  Your surgeon will make an incision over the hernia.  The tissues of the hernia will be moved back into place.  The edges of the hernia may be stitched together.  The opening in the abdominal muscles will be closed with stitches (sutures). Or, your surgeon will place a mesh patch made of manmade (synthetic) material over the opening.  The incision will be closed.  A bandage (dressing) may be placed over the incision. The procedure may vary among health care providers and hospitals. What happens after the procedure?  Your blood pressure, heart rate, breathing rate, and blood oxygen level will be monitored until the medicines you were given have worn off.  You may be given medicine for pain.  Do not drive for 24 hours if you received a sedative. This information is not intended to replace advice given to you by your health care provider. Make sure you discuss any questions you  have with your health care provider. Document Released: 11/01/2000 Document Revised: 11/26/2015 Document Reviewed: 10/20/2015 Elsevier Interactive Patient Education  2018 [place], Adult, Care After These instructions give you information about caring for yourself after your procedure. Your doctor may also give you more specific instructions. If you have problems or questions, contact your doctor. Follow these instructions at home: Surgical cut (incision) care   Follow instructions from your doctor about how to take care of your surgical cut area. Make sure you: ? Wash your hands with soap and water before you change your bandage (dressing). If you cannot use soap and water, use hand sanitizer. ? Change your bandage as told by your doctor. ? Leave stitches (sutures), skin glue, or skin tape (adhesive) strips in place. They may need to stay in place for 2 weeks or longer. If tape strips get loose and curl up, you may trim the loose edges. Do  not remove tape strips completely unless your doctor says it is okay.  Check your surgical cut every day for signs of infection. Check for: ? More redness, swelling, or pain. ? More fluid or blood. ? Warmth. ? Pus or a bad smell. Activity  Do not drive or use heavy machinery while taking prescription pain medicine. Do not drive until your doctor says it is okay.  Until your doctor says it is okay: ? Do not lift anything that is heavier than 10 lb (4.5 kg). ? Do not play contact sports.  Return to your normal activities as told by your doctor. Ask your doctor what activities are safe. General instructions  To prevent or treat having a hard time pooping (constipation) while you are taking prescription pain medicine, your doctor may recommend that you: ? Drink enough fluid to keep your pee (urine) clear or pale yellow. ? Take over-the-counter or prescription medicines. ? Eat foods that are high in fiber, such as fresh  fruits and vegetables, whole grains, and beans. ? Limit foods that are high in fat and processed sugars, such as fried and sweet foods.  Take over-the-counter and prescription medicines only as told by your doctor.  Do not take baths, swim, or use a hot tub until your doctor says it is okay.  Keep all follow-up visits as told by your doctor. This is important. Contact a doctor if:  You develop a rash.  You have more redness, swelling, or pain around your surgical cut.  You have more fluid or blood coming from your surgical cut.  Your surgical cut feels warm to the touch.  You have pus or a bad smell coming from your surgical cut.  You have a fever or chills.  You have blood in your poop (stool).  You have not pooped in 2-3 days.  Medicine does not help your pain. Get help right away if:  You have chest pain or you are short of breath.  You feel light-headed.  You feel weak and dizzy (feel faint).  You have very bad pain.  You throw up (vomit) and your pain is worse. This information is not intended to replace advice given to you by your health care provider. Make sure you discuss any questions you have with your health care provider. Document Released: 05/29/2014 Document Revised: 11/26/2015 Document Reviewed: 10/20/2015 Elsevier Interactive Patient Education  2018 [place] Anesthesia, Adult General anesthesia is the use of medicines to make a person "go to sleep" (be unconscious) for a medical procedure. General anesthesia is often recommended when a procedure:  Is long.  Requires you to be still or in an unusual position.  Is major and can cause you to lose blood.  Is impossible to do without general anesthesia.  The medicines used for general anesthesia are called general anesthetics. In addition to making you sleep, the medicines:  Prevent pain.  Control your blood pressure.  Relax your muscles.  Tell a health care provider about:  Any  allergies you have.  All medicines you are taking, including vitamins, herbs, eye drops, creams, and over-the-counter medicines.  Any problems you or family members have had with anesthetic medicines.  Types of anesthetics you have had in the past.  Any bleeding disorders you have.  Any surgeries you have had.  Any medical conditions you have.  Any history of heart or lung conditions, such as heart failure, sleep apnea, or chronic obstructive pulmonary disease (COPD).  Whether you are pregnant  or may be pregnant.  Whether you use tobacco, alcohol, marijuana, or street drugs.  Any history of Armed forces logistics/support/administrative officer.  Any history of depression or anxiety. What are the risks? Generally, this is a safe procedure. However, problems may occur, including:  Allergic reaction to anesthetics.  Lung and heart problems.  Inhaling food or liquids from your stomach into your lungs (aspiration).  Injury to nerves.  Waking up during your procedure and being unable to move (rare).  Extreme agitation or a state of mental confusion (delirium) when you wake up from the anesthetic.  Air in the bloodstream, which can lead to stroke.  These problems are more likely to develop if you are having a major surgery or if you have an advanced medical condition. You can prevent some of these complications by answering all of your health care provider's questions thoroughly and by following all pre-procedure instructions. General anesthesia can cause side effects, including:  Nausea or vomiting  A sore throat from the breathing tube.  Feeling cold or shivery.  Feeling tired, washed out, or achy.  Sleepiness or drowsiness.  Confusion or agitation.  What happens before the procedure? Staying hydrated Follow instructions from your health care provider about hydration, which may include:  Up to 2 hours before the procedure - you may continue to drink clear liquids, such as water, clear fruit juice,  black coffee, and plain tea.  Eating and drinking restrictions Follow instructions from your health care provider about eating and drinking, which may include:  8 hours before the procedure - stop eating heavy meals or foods such as meat, fried foods, or fatty foods.  6 hours before the procedure - stop eating light meals or foods, such as toast or cereal.  6 hours before the procedure - stop drinking milk or drinks that contain milk.  2 hours before the procedure - stop drinking clear liquids.  Medicines  Ask your health care provider about: ? Changing or stopping your regular medicines. This is especially important if you are taking diabetes medicines or blood thinners. ? Taking medicines such as aspirin and ibuprofen. These medicines can thin your blood. Do not take these medicines before your procedure if your health care provider instructs you not to. ? Taking new dietary supplements or medicines. Do not take these during the week before your procedure unless your health care provider approves them.  If you are told to take a medicine or to continue taking a medicine on the day of the procedure, take the medicine with sips of water. General instructions   Ask if you will be going home the same day, the following day, or after a longer hospital stay. ? Plan to have someone take you home. ? Plan to have someone stay with you for the first 24 hours after you leave the hospital or clinic.  For 3-6 weeks before the procedure, try not to use any tobacco products, such as cigarettes, chewing tobacco, and e-cigarettes.  You may brush your teeth on the morning of the procedure, but make sure to spit out the toothpaste. What happens during the procedure?  You will be given anesthetics through a mask and through an IV tube in one of your veins.  You may receive medicine to help you relax (sedative).  As soon as you are asleep, a breathing tube may be used to help you breathe.  An  anesthesia specialist will stay with you throughout the procedure. He or she will help keep you comfortable and  safe by continuing to give you medicines and adjusting the amount of medicine that you get. He or she will also watch your blood pressure, pulse, and oxygen levels to make sure that the anesthetics do not cause any problems.  If a breathing tube was used to help you breathe, it will be removed before you wake up. The procedure may vary among health care providers and hospitals. What happens after the procedure?  You will wake up, often slowly, after the procedure is complete, usually in a recovery area.  Your blood pressure, heart rate, breathing rate, and blood oxygen level will be monitored until the medicines you were given have worn off.  You may be given medicine to help you calm down if you feel anxious or agitated.  If you will be going home the same day, your health care provider may check to make sure you can stand, drink, and urinate.  Your health care providers will treat your pain and side effects before you go home.  Do not drive for 24 hours if you received a sedative.  You may: ? Feel nauseous and vomit. ? Have a sore throat. ? Have mental slowness. ? Feel cold or shivery. ? Feel sleepy. ? Feel tired. ? Feel sore or achy, even in parts of your body where you did not have surgery. This information is not intended to replace advice given to you by your health care provider. Make sure you discuss any questions you have with your health care provider. Document Released: 08/15/2007 Document Revised: 10/19/2015 Document Reviewed: 04/22/2015 Elsevier Interactive Patient Education  2018 [place] Anesthesia, Adult, Care After These instructions provide you with information about caring for yourself after your procedure. Your health care provider may also give you more specific instructions. Your treatment has been planned according to current medical  practices, but problems sometimes occur. Call your health care provider if you have any problems or questions after your procedure. What can I expect after the procedure? After the procedure, it is common to have:  Vomiting.  A sore throat.  Mental slowness.  It is common to feel:  Nauseous.  Cold or shivery.  Sleepy.  Tired.  Sore or achy, even in parts of your body where you did not have surgery.  Follow these instructions at home: For at least 24 hours after the procedure:  Do not: ? Participate in activities where you could fall or become injured. ? Drive. ? Use heavy machinery. ? Drink alcohol. ? Take sleeping pills or medicines that cause drowsiness. ? Make important decisions or sign legal documents. ? Take care of children on your own.  Rest. Eating and drinking  If you vomit, drink water, juice, or soup when you can drink without vomiting.  Drink enough fluid to keep your urine clear or pale yellow.  Make sure you have little or no nausea before eating solid foods.  Follow the diet recommended by your health care provider. General instructions  Have a responsible adult stay with you until you are awake and alert.  Return to your normal activities as told by your health care provider. Ask your health care provider what activities are safe for you.  Take over-the-counter and prescription medicines only as told by your health care provider.  If you smoke, do not smoke without supervision.  Keep all follow-up visits as told by your health care provider. This is important. Contact a health care provider if:  You continue to have nausea or vomiting  at home, and medicines are not helpful.  You cannot drink fluids or start eating again.  You cannot urinate after 8-12 hours.  You develop a skin rash.  You have fever.  You have increasing redness at the site of your procedure. Get help right away if:  You have difficulty breathing.  You have  chest pain.  You have unexpected bleeding.  You feel that you are having a life-threatening or urgent problem. This information is not intended to replace advice given to you by your health care provider. Make sure you discuss any questions you have with your health care provider. Document Released: 08/14/2000 Document Revised: 10/11/2015 Document Reviewed: 04/22/2015 Elsevier Interactive Patient Education  [employer].

## 2017-05-16 ENCOUNTER — Other Ambulatory Visit: Payer: Self-pay

## 2017-05-16 ENCOUNTER — Encounter (HOSPITAL_COMMUNITY): Payer: Self-pay

## 2017-05-16 ENCOUNTER — Encounter (HOSPITAL_COMMUNITY)
Admission: RE | Admit: 2017-05-16 | Discharge: 2017-05-16 | Disposition: A | Discharge status: Home / Self Care | Payer: Managed Care, Other (non HMO) | Source: Ambulatory Visit | Attending: General Surgery | Admitting: General Surgery

## 2017-05-16 DIAGNOSIS — Z8249 Family history of ischemic heart disease and other diseases of the circulatory system: Secondary | ICD-10-CM | POA: Diagnosis not present

## 2017-05-16 DIAGNOSIS — F319 Bipolar disorder, unspecified: Secondary | ICD-10-CM | POA: Diagnosis not present

## 2017-05-16 DIAGNOSIS — K429 Umbilical hernia without obstruction or gangrene: Secondary | ICD-10-CM | POA: Diagnosis present

## 2017-05-16 DIAGNOSIS — K432 Incisional hernia without obstruction or gangrene: Secondary | ICD-10-CM | POA: Diagnosis not present

## 2017-05-16 DIAGNOSIS — Z91040 Latex allergy status: Secondary | ICD-10-CM | POA: Diagnosis not present

## 2017-05-16 DIAGNOSIS — F419 Anxiety disorder, unspecified: Secondary | ICD-10-CM | POA: Diagnosis not present

## 2017-05-16 DIAGNOSIS — R490 Dysphonia: Secondary | ICD-10-CM | POA: Diagnosis not present

## 2017-05-16 DIAGNOSIS — Z79899 Other long term (current) drug therapy: Secondary | ICD-10-CM | POA: Diagnosis not present

## 2017-05-16 HISTORY — DX: Adverse effect of unspecified anesthetic, initial encounter: T41.45XA

## 2017-05-16 HISTORY — DX: Anxiety disorder, unspecified: F41.9

## 2017-05-16 HISTORY — DX: Nausea with vomiting, unspecified: R11.2

## 2017-05-16 HISTORY — DX: Other specified postprocedural states: Z98.890

## 2017-05-16 HISTORY — DX: Depression, unspecified: F32.A

## 2017-05-16 HISTORY — DX: Other complications of anesthesia, initial encounter: T88.59XA

## 2017-05-16 HISTORY — DX: Bipolar disorder, unspecified: F31.9

## 2017-05-16 HISTORY — DX: Major depressive disorder, single episode, unspecified: F32.9

## 2017-05-16 LAB — PREGNANCY, URINE: PREG TEST UR: NEGATIVE

## 2017-05-18 ENCOUNTER — Encounter (HOSPITAL_COMMUNITY): Payer: Self-pay

## 2017-05-18 ENCOUNTER — Ambulatory Visit (HOSPITAL_COMMUNITY)
Admission: RE | Admit: 2017-05-18 | Discharge: 2017-05-18 | Disposition: A | Discharge status: Home / Self Care | Payer: Managed Care, Other (non HMO) | Source: Ambulatory Visit | Attending: General Surgery | Admitting: General Surgery

## 2017-05-18 ENCOUNTER — Ambulatory Visit (HOSPITAL_COMMUNITY): Payer: Managed Care, Other (non HMO) | Admitting: Anesthesiology

## 2017-05-18 ENCOUNTER — Encounter (HOSPITAL_COMMUNITY)
Admission: RE | Disposition: A | Discharge status: Home / Self Care | Payer: Self-pay | Source: Ambulatory Visit | Attending: General Surgery

## 2017-05-18 DIAGNOSIS — F419 Anxiety disorder, unspecified: Secondary | ICD-10-CM | POA: Insufficient documentation

## 2017-05-18 DIAGNOSIS — K429 Umbilical hernia without obstruction or gangrene: Secondary | ICD-10-CM | POA: Diagnosis not present

## 2017-05-18 DIAGNOSIS — Z91040 Latex allergy status: Secondary | ICD-10-CM | POA: Insufficient documentation

## 2017-05-18 DIAGNOSIS — F319 Bipolar disorder, unspecified: Secondary | ICD-10-CM | POA: Insufficient documentation

## 2017-05-18 DIAGNOSIS — Z8249 Family history of ischemic heart disease and other diseases of the circulatory system: Secondary | ICD-10-CM | POA: Insufficient documentation

## 2017-05-18 DIAGNOSIS — K432 Incisional hernia without obstruction or gangrene: Secondary | ICD-10-CM | POA: Diagnosis not present

## 2017-05-18 DIAGNOSIS — R490 Dysphonia: Secondary | ICD-10-CM | POA: Insufficient documentation

## 2017-05-18 DIAGNOSIS — Z79899 Other long term (current) drug therapy: Secondary | ICD-10-CM | POA: Insufficient documentation

## 2017-05-18 HISTORY — PX: UMBILICAL HERNIA REPAIR: SHX196

## 2017-05-18 SURGERY — REPAIR, HERNIA, UMBILICAL, ADULT
Anesthesia: General

## 2017-05-18 MED ORDER — FENTANYL CITRATE (PF) 100 MCG/2ML IJ SOLN
INTRAMUSCULAR | Status: AC
  Filled 2017-05-18: qty 2

## 2017-05-18 MED ORDER — FENTANYL CITRATE (PF) 100 MCG/2ML IJ SOLN: 50.0000 ug | INTRAMUSCULAR | Status: DC | PRN

## 2017-05-18 MED ORDER — ONDANSETRON HCL 4 MG/2ML IJ SOLN
INTRAMUSCULAR | Status: AC
  Filled 2017-05-18: qty 2

## 2017-05-18 MED ORDER — FENTANYL CITRATE (PF) 100 MCG/2ML IJ SOLN
25.0000 ug | INTRAMUSCULAR | Status: DC | PRN
  Administered 2017-05-18 (×2): 50 ug via INTRAVENOUS

## 2017-05-18 MED ORDER — LIDOCAINE HCL (CARDIAC) 20 MG/ML IV SOLN
INTRAVENOUS | Status: DC | PRN
  Administered 2017-05-18: 40 mg via INTRAVENOUS

## 2017-05-18 MED ORDER — DEXAMETHASONE SODIUM PHOSPHATE 4 MG/ML IJ SOLN
INTRAMUSCULAR | Status: AC
Start: 2017-05-18 — End: ?
  Filled 2017-05-18: qty 1

## 2017-05-18 MED ORDER — PROPOFOL 10 MG/ML IV BOLUS
INTRAVENOUS | Status: AC
  Filled 2017-05-18: qty 20

## 2017-05-18 MED ORDER — LACTATED RINGERS IV SOLN
INTRAVENOUS | Status: DC
  Administered 2017-05-18: 1000 mL via INTRAVENOUS

## 2017-05-18 MED ORDER — PROPOFOL 10 MG/ML IV BOLUS
INTRAVENOUS | Status: AC
  Filled 2017-05-18: qty 40

## 2017-05-18 MED ORDER — CEFAZOLIN SODIUM-DEXTROSE 2-4 GM/100ML-% IV SOLN
2.0000 g | INTRAVENOUS | Status: AC
  Administered 2017-05-18: 2 g via INTRAVENOUS
  Filled 2017-05-18: qty 100

## 2017-05-18 MED ORDER — SCOPOLAMINE 1 MG/3DAYS TD PT72
MEDICATED_PATCH | TRANSDERMAL | Status: AC
  Filled 2017-05-18: qty 1

## 2017-05-18 MED ORDER — CHLORHEXIDINE GLUCONATE CLOTH 2 % EX PADS: 6.0000 | MEDICATED_PAD | Freq: Once | CUTANEOUS | Status: DC

## 2017-05-18 MED ORDER — FENTANYL CITRATE (PF) 250 MCG/5ML IJ SOLN
INTRAMUSCULAR | Status: AC
  Filled 2017-05-18: qty 5

## 2017-05-18 MED ORDER — SCOPOLAMINE 1 MG/3DAYS TD PT72
1.0000 | MEDICATED_PATCH | Freq: Once | TRANSDERMAL | Status: DC
  Administered 2017-05-18: 1.5 mg via TRANSDERMAL

## 2017-05-18 MED ORDER — SUCCINYLCHOLINE CHLORIDE 20 MG/ML IJ SOLN
INTRAMUSCULAR | Status: AC
Start: 2017-05-18 — End: ?
  Filled 2017-05-18: qty 1

## 2017-05-18 MED ORDER — BUPIVACAINE LIPOSOME 1.3 % IJ SUSP
INTRAMUSCULAR | Status: AC
  Filled 2017-05-18: qty 20

## 2017-05-18 MED ORDER — FENTANYL CITRATE (PF) 100 MCG/2ML IJ SOLN
INTRAMUSCULAR | Status: DC | PRN
  Administered 2017-05-18 (×2): 50 ug via INTRAVENOUS
  Administered 2017-05-18 (×2): 25 ug via INTRAVENOUS

## 2017-05-18 MED ORDER — DOCUSATE SODIUM 100 MG PO CAPS: 100.0000 mg | ORAL_CAPSULE | Freq: Two times a day (BID) | ORAL | 2 refills | Status: AC | PRN

## 2017-05-18 MED ORDER — ONDANSETRON HCL 4 MG/2ML IJ SOLN
4.0000 mg | Freq: Once | INTRAMUSCULAR | Status: AC
  Administered 2017-05-18: 4 mg via INTRAVENOUS

## 2017-05-18 MED ORDER — MIDAZOLAM HCL 2 MG/2ML IJ SOLN
INTRAMUSCULAR | Status: AC
  Filled 2017-05-18: qty 2

## 2017-05-18 MED ORDER — DEXAMETHASONE SODIUM PHOSPHATE 4 MG/ML IJ SOLN
4.0000 mg | Freq: Once | INTRAMUSCULAR | Status: AC
  Administered 2017-05-18: 4 mg via INTRAVENOUS

## 2017-05-18 MED ORDER — FENTANYL CITRATE (PF) 100 MCG/2ML IJ SOLN
25.0000 ug | Freq: Once | INTRAMUSCULAR | Status: AC
  Administered 2017-05-18: 25 ug via INTRAVENOUS

## 2017-05-18 MED ORDER — SODIUM CHLORIDE 0.9 % IR SOLN
Status: DC | PRN
  Administered 2017-05-18: 1000 mL

## 2017-05-18 MED ORDER — OXYCODONE HCL 5 MG PO TABS: 5.0000 mg | ORAL_TABLET | ORAL | 0 refills | Status: AC | PRN

## 2017-05-18 MED ORDER — LIDOCAINE HCL (PF) 1 % IJ SOLN
INTRAMUSCULAR | Status: AC
  Filled 2017-05-18: qty 5

## 2017-05-18 MED ORDER — BUPIVACAINE LIPOSOME 1.3 % IJ SUSP
INTRAMUSCULAR | Status: DC | PRN
  Administered 2017-05-18: 20 mL

## 2017-05-18 MED ORDER — PROPOFOL 10 MG/ML IV BOLUS
INTRAVENOUS | Status: DC | PRN
  Administered 2017-05-18: 150 mg via INTRAVENOUS

## 2017-05-18 MED ORDER — MIDAZOLAM HCL 2 MG/2ML IJ SOLN
1.0000 mg | INTRAMUSCULAR | Status: AC
Start: 2017-05-18 — End: 2017-05-18
  Administered 2017-05-18: 2 mg via INTRAVENOUS

## 2017-05-18 SURGICAL SUPPLY — 36 items
BAG HAMPER (MISCELLANEOUS) ×3 IMPLANT
BLADE SURG 15 STRL LF DISP TIS (BLADE) ×1 IMPLANT
BLADE SURG 15 STRL SS (BLADE) ×2
BLADE SURG SZ11 CARB STEEL (BLADE) ×3 IMPLANT
CHLORAPREP W/TINT 26ML (MISCELLANEOUS) ×3 IMPLANT
CLOTH BEACON ORANGE TIMEOUT ST (SAFETY) ×3 IMPLANT
COVER LIGHT HANDLE STERIS (MISCELLANEOUS) ×6 IMPLANT
DERMABOND ADVANCED (GAUZE/BANDAGES/DRESSINGS) ×2
DERMABOND ADVANCED .7 DNX12 (GAUZE/BANDAGES/DRESSINGS) ×1 IMPLANT
ELECT REM PT RETURN 9FT ADLT (ELECTROSURGICAL) ×3
ELECTRODE REM PT RTRN 9FT ADLT (ELECTROSURGICAL) ×1 IMPLANT
GLOVE BIO SURGEON STRL SZ 6.5 (GLOVE) ×2 IMPLANT
GLOVE BIO SURGEONS STRL SZ 6.5 (GLOVE) ×1
GLOVE BIOGEL PI IND STRL 6.5 (GLOVE) ×2 IMPLANT
GLOVE BIOGEL PI IND STRL 7.0 (GLOVE) ×2 IMPLANT
GLOVE BIOGEL PI INDICATOR 6.5 (GLOVE) ×4
GLOVE BIOGEL PI INDICATOR 7.0 (GLOVE) ×4
GLOVE SURG SS PI 6.5 STRL IVOR (GLOVE) ×3 IMPLANT
GLOVE SURG SS PI 7.0 STRL IVOR (GLOVE) ×3 IMPLANT
GOWN STRL REUS W/ TWL LRG LVL3 (GOWN DISPOSABLE) ×1 IMPLANT
GOWN STRL REUS W/TWL LRG LVL3 (GOWN DISPOSABLE) ×5 IMPLANT
INST SET MINOR GENERAL (KITS) ×3 IMPLANT
KIT ROOM TURNOVER APOR (KITS) ×3 IMPLANT
MANIFOLD NEPTUNE II (INSTRUMENTS) ×3 IMPLANT
MESH VENTRALEX ST 8CM LRG (Mesh General) ×3 IMPLANT
NEEDLE HYPO 21X1.5 SAFETY (NEEDLE) ×3 IMPLANT
NS IRRIG 1000ML POUR BTL (IV SOLUTION) ×3 IMPLANT
PACK MINOR (CUSTOM PROCEDURE TRAY) ×3 IMPLANT
PAD ARMBOARD 7.5X6 YLW CONV (MISCELLANEOUS) ×3 IMPLANT
SET BASIN LINEN APH (SET/KITS/TRAYS/PACK) ×3 IMPLANT
SPONGE GAUZE 2X2 8PLY STER LF (GAUZE/BANDAGES/DRESSINGS) ×3
SPONGE GAUZE 2X2 8PLY STRL LF (GAUZE/BANDAGES/DRESSINGS) ×6 IMPLANT
SUT ETHIBOND NAB MO 7 #0 18IN (SUTURE) ×3 IMPLANT
SUT VICRYL AB 3-0 FS1 BRD 27IN (SUTURE) ×6 IMPLANT
SYR 20CC LL (SYRINGE) ×3 IMPLANT
TAPE CLOTH SURG 4X10 WHT LF (GAUZE/BANDAGES/DRESSINGS) ×3 IMPLANT

## 2017-05-18 NOTE — Addendum Note (Signed)
Addendum  created 05/18/17 [id number] by Laurene FootmanGonzalez, Idy Rawling, MD   Order list changed, Order sets accessed

## 2017-05-18 NOTE — Interval H&P Note (Signed)
History and Physical Interval Note:  05/18/2017 7:15 AM  Diane [name]  has presented today for surgery, with the diagnosis of umbilical hernia  The various methods of treatment have been discussed with the patient and family. After consideration of risks, benefits and other options for treatment, the patient has consented to  Procedure(s): HERNIA REPAIR UMBILICAL ADULT WITH MESH (N/A) as a surgical intervention .  The patient's history has been reviewed, patient examined, no change in status, stable for surgery.  I have reviewed the patient's chart and labs.  Questions were answered to the patient's satisfaction.    No additional questions. Doing well.  Lucretia RoersLindsay C Bridges

## 2017-05-18 NOTE — Anesthesia Postprocedure Evaluation (Signed)
Anesthesia Post Note  Patient: Diane [name]  Procedure(s) Performed: INCISIONAL HERNIA REPAIR WITH MESH (N/A )  Patient location during evaluation: PACU Anesthesia Type: General Level of consciousness: awake and alert, oriented and patient cooperative Pain management: pain level controlled Vital Signs Assessment: post-procedure vital signs reviewed and stable Respiratory status: spontaneous breathing Cardiovascular status: stable Postop Assessment: no apparent nausea or vomiting Anesthetic complications: no     Last Vitals:  Vitals:   05/18/17 0900 05/18/17 0907  BP: 116/79   Pulse: 66 (!) 58  Resp: 10 12  Temp:    SpO2: 95% 94%    Last Pain:  Vitals:   05/18/17 0907  TempSrc:   PainSc: 8                  ADAMS, AMY A

## 2017-05-18 NOTE — Transfer of Care (Signed)
Immediate Anesthesia Transfer of Care Note  Patient: Diane [name]  Procedure(s) Performed: INCISIONAL HERNIA REPAIR WITH MESH (N/A )  Patient Location: PACU  Anesthesia Type:General  Level of Consciousness: awake, oriented and patient cooperative  Airway & Oxygen Therapy: Patient Spontanous Breathing  Post-op Assessment: Report given to RN and Post -op Vital signs reviewed and stable  Post vital signs: Reviewed and stable  Last Vitals:  Vitals:   05/18/17 0720 05/18/17 0725  BP: 111/69 102/67  Resp: 20 (!) 0  Temp:    SpO2: 100% 96%    Last Pain:  Vitals:   05/18/17 0639  TempSrc: Oral      Patients Stated Pain Goal: 5 (05/18/17 [id number])  Complications: No apparent anesthesia complications

## 2017-05-18 NOTE — Anesthesia Preprocedure Evaluation (Signed)
Anesthesia Evaluation  Patient identified by MRN, date of birth, ID band Patient awake    Reviewed: Allergy & Precautions, NPO status , Patient's Chart, lab work & pertinent test results  History of Anesthesia Complications (+) PONV and history of anesthetic complications  Airway Mallampati: II  TM Distance: >3 FB Neck ROM: Full   Comment: Chronic hoarseness after surgery 6 yrs ago. Vocal cord trauma?? Dental  (+) Poor Dentition   Pulmonary neg pulmonary ROS,    breath sounds clear to auscultation       Cardiovascular negative cardio ROS   Rhythm:Regular Rate:Normal     Neuro/Psych PSYCHIATRIC DISORDERS Anxiety Depression Bipolar Disorder    GI/Hepatic negative GI ROS, Neg liver ROS,   Endo/Other  negative endocrine ROS  Renal/GU Renal disease     Musculoskeletal   Abdominal   Peds  Hematology negative hematology ROS (+)   Anesthesia Other Findings   Reproductive/Obstetrics                             Anesthesia Physical Anesthesia Plan  ASA: II  Anesthesia Plan: General   Post-op Pain Management:    Induction: Intravenous  PONV Risk Score and Plan:   Airway Management Planned: LMA  Additional Equipment:   Intra-op Plan:   Post-operative Plan: Extubation in OR  Informed Consent: I have reviewed the patients History and Physical, chart, labs and discussed the procedure including the risks, benefits and alternatives for the proposed anesthesia with the patient or authorized representative who has indicated his/her understanding and acceptance.     Plan Discussed with:   Anesthesia Plan Comments: (Possible GOT)        Anesthesia Quick Evaluation

## 2017-05-18 NOTE — Discharge Instructions (Signed)
Discharge Instructions: Shower per your regular routine after removing the bandage at the belly butt in 48 hrs. Prior to this you can sponge bathe.  Take tylenol and ibuprofen as needed for pain control, alternating every 4-6 hours.  Take Roxicodone for breakthrough pain. Take colace for constipation related to narcotic pain medication. Do not pick at the dermabond glue on your incision sites.    Open Hernia Repair, Adult, Care After These instructions give you information about caring for yourself after your procedure. Your doctor may also give you more specific instructions. If you have problems or questions, contact your doctor. Follow these instructions at home: Surgical cut (incision) care   Follow instructions from your doctor about how to take care of your surgical cut area. Make sure you: ? Wash your hands with soap and water before you change your bandage (dressing). If you cannot use soap and water, use hand sanitizer. ? Change your bandage as told by your doctor. ? Leave stitches (sutures), skin glue, or skin tape (adhesive) strips in place. They may need to stay in place for 2 weeks or longer. If tape strips get loose and curl up, you may trim the loose edges. Do not remove tape strips completely unless your doctor says it is okay.  Check your surgical cut every day for signs of infection. Check for: ? More redness, swelling, or pain. ? More fluid or blood. ? Warmth. ? Pus or a bad smell. Activity  Do not drive or use heavy machinery while taking prescription pain medicine. Do not drive until your doctor says it is okay.  Until your doctor says it is okay: ? Do not lift anything that is heavier than 10 lb (4.5 kg). ? Do not play contact sports.  Return to your normal activities as told by your doctor. Ask your doctor what activities are safe. General instructions  To prevent or treat having a hard time pooping (constipation) while you are taking prescription pain  medicine, your doctor may recommend that you: ? Drink enough fluid to keep your pee (urine) clear or pale yellow. ? Take over-the-counter or prescription medicines. ? Eat foods that are high in fiber, such as fresh fruits and vegetables, whole grains, and beans. ? Limit foods that are high in fat and processed sugars, such as fried and sweet foods.  Take over-the-counter and prescription medicines only as told by your doctor.  Do not take baths, swim, or use a hot tub until your doctor says it is okay.  Keep all follow-up visits as told by your doctor. This is important. Contact a doctor if:  You develop a rash.  You have more redness, swelling, or pain around your surgical cut.  You have more fluid or blood coming from your surgical cut.  Your surgical cut feels warm to the touch.  You have pus or a bad smell coming from your surgical cut.  You have a fever or chills.  You have blood in your poop (stool).  You have not pooped in 2-3 days.  Medicine does not help your pain. Get help right away if:  You have chest pain or you are short of breath.  You feel light-headed.  You feel weak and dizzy (feel faint).  You have very bad pain.  You throw up (vomit) and your pain is worse. This information is not intended to replace advice given to you by your health care provider. Make sure you discuss any questions you have with your health care  provider. Document Released: 05/29/2014 Document Revised: 11/26/2015 Document Reviewed: 10/20/2015 Elsevier Interactive Patient Education  2018 [employer]    PATIENT INSTRUCTIONS POST-ANESTHESIA  IMMEDIATELY FOLLOWING SURGERY:  Do not drive or operate machinery for the first twenty four hours after surgery.  Do not make any important decisions for twenty four hours after surgery or while taking narcotic pain medications or sedatives.  If you develop intractable nausea and vomiting or a severe headache please notify your doctor  immediately.  FOLLOW-UP:  Please make an appointment with your surgeon as instructed. You do not need to follow up with anesthesia unless specifically instructed to do so.  WOUND CARE INSTRUCTIONS (if applicable):  Keep a dry clean dressing on the anesthesia/puncture wound site if there is drainage.  Once the wound has quit draining you may leave it open to air.  Generally you should leave the bandage intact for twenty four hours unless there is drainage.  If the epidural site drains for more than 36-48 hours please call the anesthesia department.  QUESTIONS?:  Please feel free to call your physician or the hospital operator if you have any questions, and they will be happy to assist you.

## 2017-05-18 NOTE — Anesthesia Procedure Notes (Signed)
Procedure Name: LMA Insertion Date/Time: 05/18/2017 7:34 AM Performed by: Pernell DupreAdams, Amy A, CRNA Pre-anesthesia Checklist: Patient identified, Timeout performed, Emergency Drugs available, Suction available and Patient being monitored Patient Re-evaluated:Patient Re-evaluated prior to induction Oxygen Delivery Method: Circle system utilized Preoxygenation: Pre-oxygenation with 100% oxygen Induction Type: IV induction Ventilation: Mask ventilation without difficulty LMA: LMA inserted LMA Size: 3.0 Number of attempts: 1 Placement Confirmation: positive ETCO2 and breath sounds checked- equal and bilateral Tube secured with: Tape Dental Injury: Teeth and Oropharynx as per pre-operative assessment

## 2017-05-18 NOTE — Op Note (Signed)
[facility] Operative Note  05/18/17  Preoperative Diagnosis: Umbilical hernia    Postoperative Diagnosis: Incisional and umbilical hernia    Procedure(s) Performed: Incisional hernia repair with mesh (Ventralex 8cm circular)    Surgeon: Leatrice JewelsLindsay C. Henreitta LeberBridges, MD   Assistants: No qualified resident was available   Anesthesia: General endotracheal   Anesthesiologist: Laurene FootmanGonzalez, Luis, MD    Specimens: None   Estimated Blood Loss: Minimal   Blood Replacement: None    Complications: None   Wound Class:Clean    Operative Indications: Ms. Diane [name] is a very sweet 36 yo who has had a tubal ligation in the past through a infraumbilical incision and a laparoscopic appendectomy through a supraumbilical incision. She was seen in my office with complaints of an umbilical hernia with what clinically felt like a defect between the two port sites.  She had complaints of discomfort with this but had never had any obstructive symptoms. She is active and works on her feet most days.  After a discussion of the risk and benefits she opted to proceed with repair.    Findings:Umbilical defect at umbilical stalk and second defect about 2cm superior at the supraumbilical port site   Procedure: The patient was taken to the operating room and placed supine. General endotracheal anesthesia was induced. Intravenous antibiotics were  administered per protocol.  The abdomen was prepared and draped in the usual sterile fashion.   The intraumbilical scar was opened and carried down through the subcutaneous tissue to the fascia with cautery.  The umbilical stalk was encircled with a hemostat where the umbilical defect was appreciated.  The hernia sac was divided and a small defect was appreciated measuring < 0.5cm.  Given her prior surgeries, I felt the anterior abdominal wall intraperitoneal and noted an additional subcentimeter hernia at the supraumbilical port site incision.  At this time she  had two defects that were approximately 2cm apart.  In order to give her the best repair and prevent missing any additional defects, I opened the fascia and palpated to identify any additional defects. The two that were connected were the only noted defects. At that time the abdominal wall was clear and all contents were reduced from the hernias.  A Ventralex 8cm circular mesh was placed and secured with 0 Ethibond sutures by the tails and additional sutures were placed intraperitoneal to ensure that the mesh lay against the abdominal wall. These were placed superior, inferior on on each side.  The midline was closed with interrupted 0 Ethibond sutures.  The umbilicus was tacked down to the fascia with a 3-0 Vicryl suture and the deep dermal layer was closed with a 3-0 Vicryl.  The skin was closed with 4-0 Monocryl and dermabond. Following the dermabond drying, a pressure dressing was placed on the umbilicus for additional compression.    Final inspection revealed acceptable hemostasis. All counts were correct at the end of the case. The patient was awakened from anesthesia and extubate without complication.  The patient went to the PACU in stable condition.   Algis GreenhouseLindsay Shifa Brisbon, MD [facility] [address] [place], [place] [phone] (office)

## 2017-05-21 ENCOUNTER — Encounter (HOSPITAL_COMMUNITY): Payer: Self-pay | Admitting: General Surgery

## 2017-06-05 ENCOUNTER — Ambulatory Visit (INDEPENDENT_AMBULATORY_CARE_PROVIDER_SITE_OTHER): Payer: Self-pay | Admitting: General Surgery

## 2017-06-05 ENCOUNTER — Encounter: Payer: Self-pay | Admitting: General Surgery

## 2017-06-05 VITALS — BP 120/80 | HR 71 | Temp 98.4°F | Resp 18 | Ht 63.0 in | Wt 139.0 lb

## 2017-06-05 DIAGNOSIS — K429 Umbilical hernia without obstruction or gangrene: Secondary | ICD-10-CM

## 2017-06-05 NOTE — Progress Notes (Signed)
[facility] Note   HPI:  37 y.o. Female presents to clinic for post-op follow-up evaluation after umbilical hernia repair with mesh. Patient reports she is doing well. She returned to work and is now at [employer]. She did lift some tea the other day and has some pain, but otherwise has been well.   Review of Systems:  No fevers or chills No abdominal pain Having Bms All other review of systems: otherwise negative   Vital Signs:  BP 120/80   Pulse 71   Temp 98.4 F (36.9 C)   Resp 18   Ht 5\' 3"  (1.6 m)   Wt 139 lb (63 kg)   BMI 24.62 kg/m    Physical Exam:  Physical Exam  Constitutional: She is well-developed, well-nourished, and in no distress.  Cardiovascular: Normal rate.  Pulmonary/Chest: Effort normal.  Abdominal: Soft. She exhibits no distension. There is no tenderness.  umbilical hernia site without erythema or drainage, mild ecchymosis on left side, small suture starting to pop out, but intact, no hernia defect and non tender  Vitals reviewed.   Laboratory studies: None   Imaging:  None   Assessment:  37 y.o. yo Female s/p umbilical hernia repair. Doing well.  Plan:  - Has work note for not lifting until March   - If suture pops out, can trim at skin - Follow up PRN  All of the above recommendations were discussed with the patient and patient's family, and all of patient's and family's questions were answered to their expressed satisfaction.  Algis GreenhouseLindsay Bridges, MD [facility] [address] [place], [place] [phone] (office)

## 2017-10-19 ENCOUNTER — Other Ambulatory Visit: Payer: Self-pay

## 2017-10-19 ENCOUNTER — Emergency Department (HOSPITAL_COMMUNITY)
Admission: EM | Admit: 2017-10-19 | Discharge: 2017-10-19 | Disposition: A | Discharge status: Home / Self Care | Payer: Managed Care, Other (non HMO) | Attending: Emergency Medicine | Admitting: Emergency Medicine

## 2017-10-19 ENCOUNTER — Encounter (HOSPITAL_COMMUNITY): Payer: Self-pay

## 2017-10-19 DIAGNOSIS — N3000 Acute cystitis without hematuria: Secondary | ICD-10-CM | POA: Insufficient documentation

## 2017-10-19 DIAGNOSIS — Z91040 Latex allergy status: Secondary | ICD-10-CM | POA: Insufficient documentation

## 2017-10-19 DIAGNOSIS — J069 Acute upper respiratory infection, unspecified: Secondary | ICD-10-CM

## 2017-10-19 DIAGNOSIS — Z79899 Other long term (current) drug therapy: Secondary | ICD-10-CM | POA: Insufficient documentation

## 2017-10-19 DIAGNOSIS — Z3202 Encounter for pregnancy test, result negative: Secondary | ICD-10-CM | POA: Insufficient documentation

## 2017-10-19 LAB — URINALYSIS, ROUTINE W REFLEX MICROSCOPIC
BILIRUBIN URINE: NEGATIVE
GLUCOSE, UA: NEGATIVE mg/dL
HGB URINE DIPSTICK: NEGATIVE
Ketones, ur: NEGATIVE mg/dL
Nitrite: POSITIVE — AB
Protein, ur: NEGATIVE mg/dL
SPECIFIC GRAVITY, URINE: 1.016 (ref 1.005–1.030)
pH: 5 (ref 5.0–8.0)

## 2017-10-19 LAB — PREGNANCY, URINE: PREG TEST UR: NEGATIVE

## 2017-10-19 LAB — GROUP A STREP BY PCR: Group A Strep by PCR: NOT DETECTED

## 2017-10-19 MED ORDER — CEPHALEXIN 500 MG PO CAPS: 500.0000 mg | ORAL_CAPSULE | Freq: Three times a day (TID) | ORAL | 0 refills | Status: AC

## 2017-10-19 MED ORDER — FLUTICASONE PROPIONATE 50 MCG/ACT NA SUSP: 2.0000 | Freq: Every day | NASAL | 0 refills | Status: AC

## 2017-10-19 MED ORDER — NAPROXEN 500 MG PO TABS: 500.0000 mg | ORAL_TABLET | Freq: Two times a day (BID) | ORAL | 0 refills | Status: AC

## 2017-10-19 MED ORDER — NAPROXEN 250 MG PO TABS
500.0000 mg | ORAL_TABLET | Freq: Once | ORAL | Status: AC
  Administered 2017-10-19: 500 mg via ORAL
  Filled 2017-10-19: qty 2

## 2017-10-19 NOTE — ED Triage Notes (Signed)
Pt c/o sore throat, cough, congestion, and dysuria for several days.

## 2017-10-19 NOTE — Discharge Instructions (Addendum)
You were seen today for urinary symptoms and upper respiratory symptoms.  You do have evidence of UTI.  Take antibiotics as prescribed.  Regarding your upper respiratory symptoms, this is likely viral.  Take naproxen as needed for pain.  Use Flonase or nasal saline to clear the nares.

## 2017-10-19 NOTE — ED Provider Notes (Signed)
[facility] EMERGENCY DEPARTMENT Provider Note   CSN: [id number] Arrival date & time: 10/19/17  4098     History   Chief Complaint Chief Complaint  Patient presents with  . Sore Throat  . Dysuria    HPI Diane [name] is a 37 y.o. female.  HPI  This is a 37 year old female with a history of bipolar disorder, UTI, renal disorder who presents with multiple complaints.  Patient reports 3 to 4-day history of increasing congestion, sore throat, cough, and dysuria.  Onset of symptoms on Monday.  She did have a coworker who was diagnosed with the flu several weeks ago.  She denies any temperatures greater than 101.  She states over the last 12 hours she has had increasing body aches and "I just do not feel good."  She has not taken anything for her symptoms.  She does report dysuria.  No back pain, abdominal pain, nausea, vomiting.  She does report loose stools.  Past Medical History:  Diagnosis Date  . Anxiety   . Bipolar disorder (HCC)   . Complication of anesthesia   . Depression   . Duplicated ureter, left   . 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 PONV (postoperative nausea and vomiting)   . 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 UTI (lower urinary tract infection)     Patient Active Problem List   Diagnosis Date Noted  . Incisional hernia, without obstruction or gangrene   . Umbilical hernia without obstruction and without gangrene   . Acute appendicitis 05/29/2015  . Poor posture 09/09/2013    Past Surgical History:  Procedure Laterality Date  . APPENDECTOMY    . HERNIA REPAIR     right inguinal hernia  . LAPAROSCOPIC APPENDECTOMY N/A 05/29/2015   Procedure: APPENDECTOMY LAPAROSCOPIC;  Surgeon: Franky Macho, MD;  Location: [facility];  Service: General;  Laterality: N/A;  . TUBAL LIGATION  2012  . UMBILICAL HERNIA REPAIR N/A 05/18/2017   Procedure: INCISIONAL HERNIA REPAIR WITH MESH;   Surgeon: Lucretia Roers, MD;  Location: [facility];  Service: General;  Laterality: N/A;  . URETER REMOVAL     Reports surgery as infant on duplicated ureters bilaterally, and injury to the right kidney causing dysfunction of that kidney, and continued left ureter duplication as adult     OB History    Gravida  6   Para  6   Term  1   Preterm  5   AB      Living  6     SAB      TAB      Ectopic      Multiple      Live Births               Home Medications    Prior to Admission medications   Medication Sig Start Date End Date Taking? Authorizing Provider  cephALEXin (KEFLEX) 500 MG capsule Take 1 capsule (500 mg total) by mouth 3 (three) times daily. 10/19/17   Donise Woodle, Mayer Masker, MD  docusate sodium (COLACE) 100 MG capsule Take 1 capsule (100 mg total) by mouth 2 (two) times daily as needed for mild constipation. 05/18/17 05/18/18  Lucretia Roers, MD  ferrous sulfate 325 (65 FE) MG EC tablet Take 325 mg by mouth daily with breakfast.    [provider]  fluticasone (FLONASE) 50 MCG/ACT nasal spray Place  2 sprays into both nostrils daily. 10/19/17   Persais Ethridge, Mayer Masker, MD  ibuprofen (ADVIL,MOTRIN) 600 MG tablet Take 1 tablet (600 mg total) by mouth every 6 (six) hours as needed. Patient not taking: Reported on 05/08/2017 04/25/17   Jacalyn Lefevre, MD  Multiple Vitamins-Calcium (ONE-A-DAY WOMENS PO) Take 1 tablet by mouth daily.    [provider]  naproxen (NAPROSYN) 500 MG tablet Take 1 tablet (500 mg total) by mouth 2 (two) times daily. 10/19/17   Breslyn Abdo, Mayer Masker, MD  ondansetron (ZOFRAN ODT) 4 MG disintegrating tablet Take 1 tablet (4 mg total) by mouth every 8 (eight) hours as needed. 04/25/17   Jacalyn Lefevre, MD  oxyCODONE (ROXICODONE) 5 MG immediate release tablet Take 1 tablet (5 mg total) by mouth every 4 (four) hours as needed for severe pain or breakthrough pain. 05/18/17 05/18/18  Lucretia Roers, MD    Family History Family  History  Problem Relation Age of Onset  . Diabetes Mother   . Stroke Father   . Heart failure Father   . Diabetes Father   . Epilepsy Brother     Social History Social History   Tobacco Use  . Smoking status: Never Smoker  . Smokeless tobacco: Never Used  Substance Use Topics  . Alcohol use: No  . Drug use: No     Allergies   Latex   Review of Systems Review of Systems  Constitutional: Positive for chills. Negative for fever.  HENT: Positive for congestion and sore throat. Negative for trouble swallowing.   Respiratory: Positive for cough. Negative for shortness of breath.   Cardiovascular: Negative for chest pain.  Gastrointestinal: Positive for diarrhea. Negative for abdominal pain, nausea and vomiting.  Genitourinary: Positive for dysuria.  All other systems reviewed and are negative.    Physical Exam Updated Vital Signs BP 105/74 (BP Location: Left Arm)   Pulse 84   Temp 98.2 F (36.8 C) (Oral)   Resp 16   Ht  (1.6 m)   Wt 65.8 kg (145 lb)   LMP 10/03/2017 (Approximate)   SpO2 99%   BMI 25.69 kg/m   Physical Exam  Constitutional: She is oriented to person, place, and time. She appears well-developed and well-nourished.  HENT:  Head: Normocephalic and atraumatic.  Mouth/Throat: Oropharynx is clear and moist.  Uvula midline, no exudate  Eyes: Pupils are equal, round, and reactive to light.  Neck: Neck supple.  Cardiovascular: Normal rate, regular rhythm and normal heart sounds.  Pulmonary/Chest: Effort normal. No respiratory distress. She has no wheezes.  Abdominal: Soft. Bowel sounds are normal.  Neurological: She is alert and oriented to person, place, and time.  Skin: Skin is warm and dry.  Psychiatric: She has a normal mood and affect.  Nursing note and vitals reviewed.    ED Treatments / Results  Labs (all labs ordered are listed, but only abnormal results are displayed) Labs Reviewed  URINALYSIS, ROUTINE W REFLEX MICROSCOPIC -  Abnormal; Notable for the following components:      Result Value   APPearance HAZY (*)    Nitrite POSITIVE (*)    Leukocytes, UA MODERATE (*)    WBC, UA >50 (*)    Bacteria, UA MANY (*)    Non Squamous Epithelial 0-5 (*)    All other components within normal limits  GROUP A STREP BY PCR  URINE CULTURE  PREGNANCY, URINE    EKG None  Radiology No results found.  Procedures Procedures (including critical care time)  Medications Ordered in ED Medications  naproxen (NAPROSYN) tablet 500 mg (500 mg Oral Given 10/19/17 1324)     Initial Impression / Assessment and Plan / ED Course  I have reviewed the triage vital signs and the nursing notes.  Pertinent labs & imaging results that were available during my care of the patient were reviewed by me and considered in my medical decision making (see chart for details).     Patient presents with upper respiratory symptoms as well as urinary symptoms.  She is overall nontoxic-appearing on exam and vital signs are reassuring.  No tonsillar exudate and given constellation of symptoms, suspect viral etiology.  Urinalysis does show evidence of UTI.  Urine culture pending.  Patient is nontoxic.  But doubt pyelonephritis.  Will treat with Keflex.  Strep screen is negative.  Recommend supportive measures for viral URI.  No indication for imaging at this time.  Doubt pneumonia.  After history, exam, and medical workup I feel the patient has been appropriately medically screened and is safe for discharge home. Pertinent diagnoses were discussed with the patient. Patient was given return precautions.   Final Clinical Impressions(s) / ED Diagnoses   Final diagnoses:  URI, acute  Acute cystitis without hematuria    ED Discharge Orders        Ordered    cephALEXin (KEFLEX) 500 MG capsule  3 times daily     10/19/17 0643    naproxen (NAPROSYN) 500 MG tablet  2 times daily     10/19/17 0643    fluticasone (FLONASE) 50 MCG/ACT nasal spray   Daily     10/19/17 0643       Shon Baton, MD 10/19/17 [phone]

## 2017-10-21 LAB — URINE CULTURE: Culture: >=100000 — AB

## 2017-10-22 ENCOUNTER — Telehealth: Payer: Self-pay | Admitting: Emergency Medicine

## 2017-10-22 NOTE — Telephone Encounter (Signed)
Post ED Visit - Positive Culture Follow-up  Culture report reviewed by antimicrobial stewardship pharmacist:  []  Enzo BiNathan Batchelder, Pharm.D. [x]  Celedonio MiyamotoJeremy Frens, Pharm.D., BCPS AQ-ID []  Garvin FilaMike Maccia, Pharm.D., BCPS []  Georgina PillionElizabeth Martin, Pharm.D., BCPS []  CollbranMinh Pham, Vermont[place]arm.D., BCPS, AAHIVP []  Estella HuskMichelle Turner, Pharm.D., BCPS, AAHIVP []  Lysle Pearlachel Rumbarger, PharmD, BCPS []  Sherlynn CarbonAustin Lucas, PharmD []  Pollyann SamplesAndy Johnston, PharmD, BCPS  Positive urine culture Treated with cephalexin, organism sensitive to the same and no further patient follow-up is required at this time.  Berle MullMiller, Bryne Lindon 10/22/2017, 11:03 AM

## 2018-03-31 IMAGING — DX DG ABDOMEN ACUTE W/ 1V CHEST
3 series · 3 of 3 positions shown · non-contrast
Comparison: 08/21/16

CLINICAL DATA: Pain just above unable

EXAM:
DG ABDOMEN ACUTE W/ 1V CHEST

[chest pa]
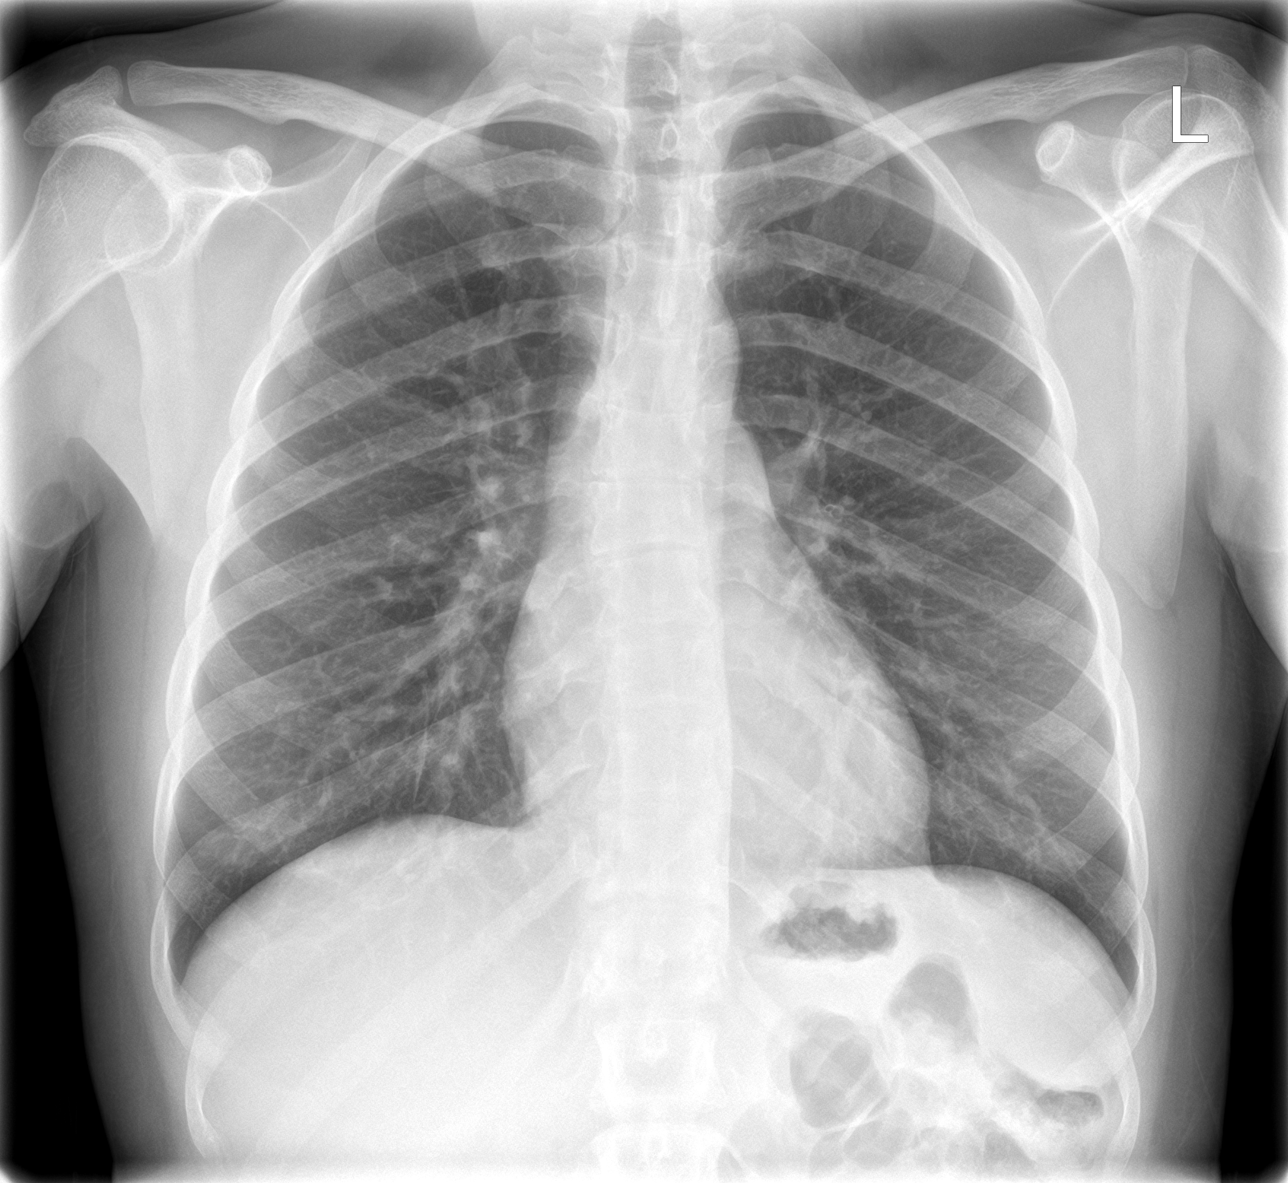

[abdomen erect]
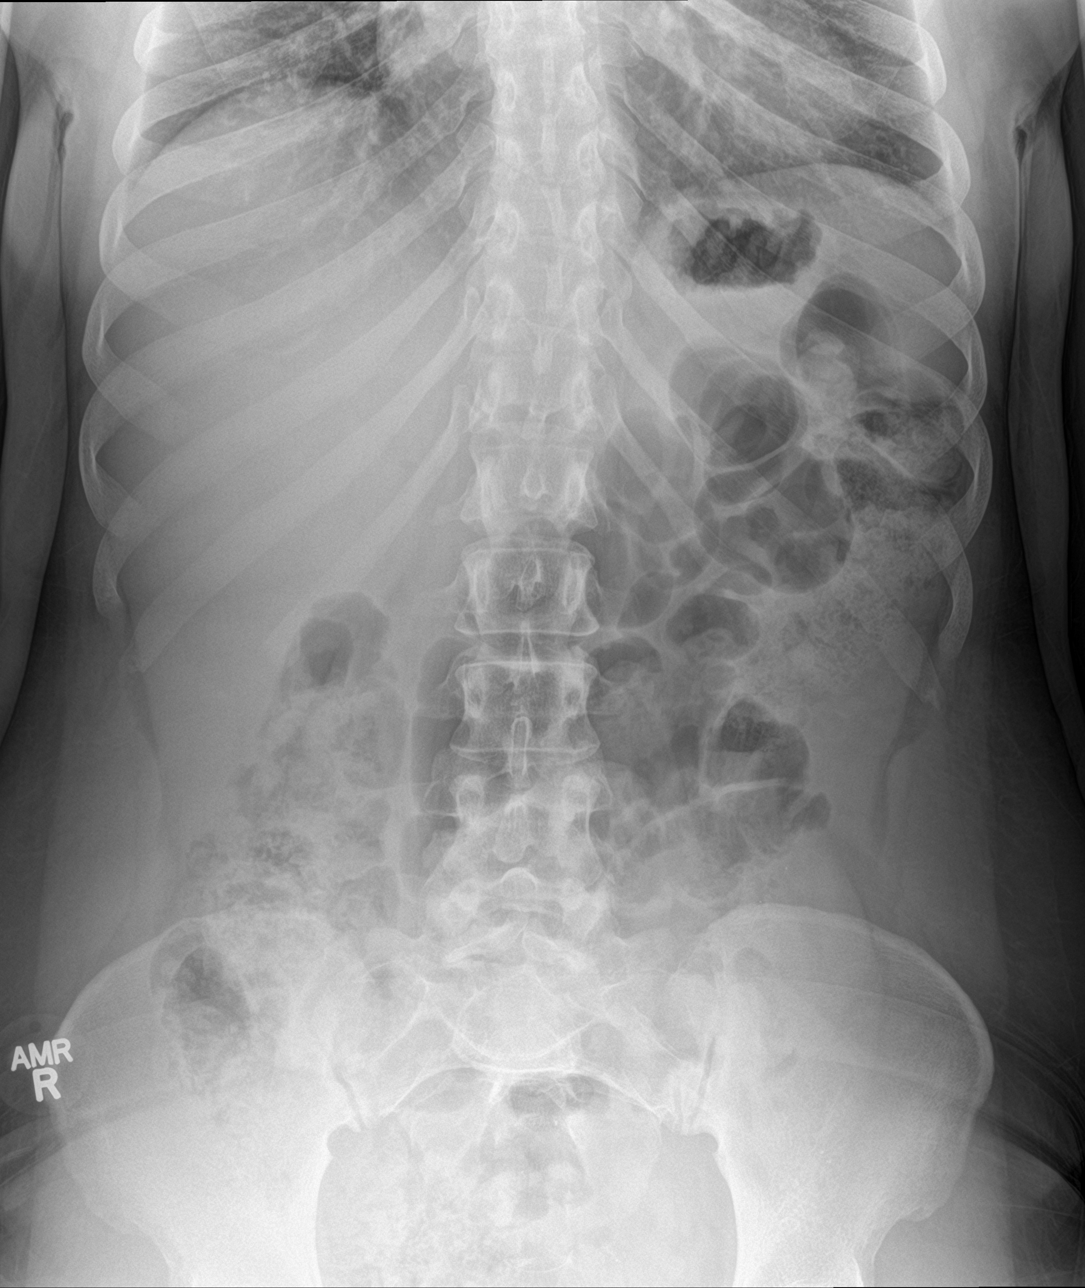

[abdomen supine]
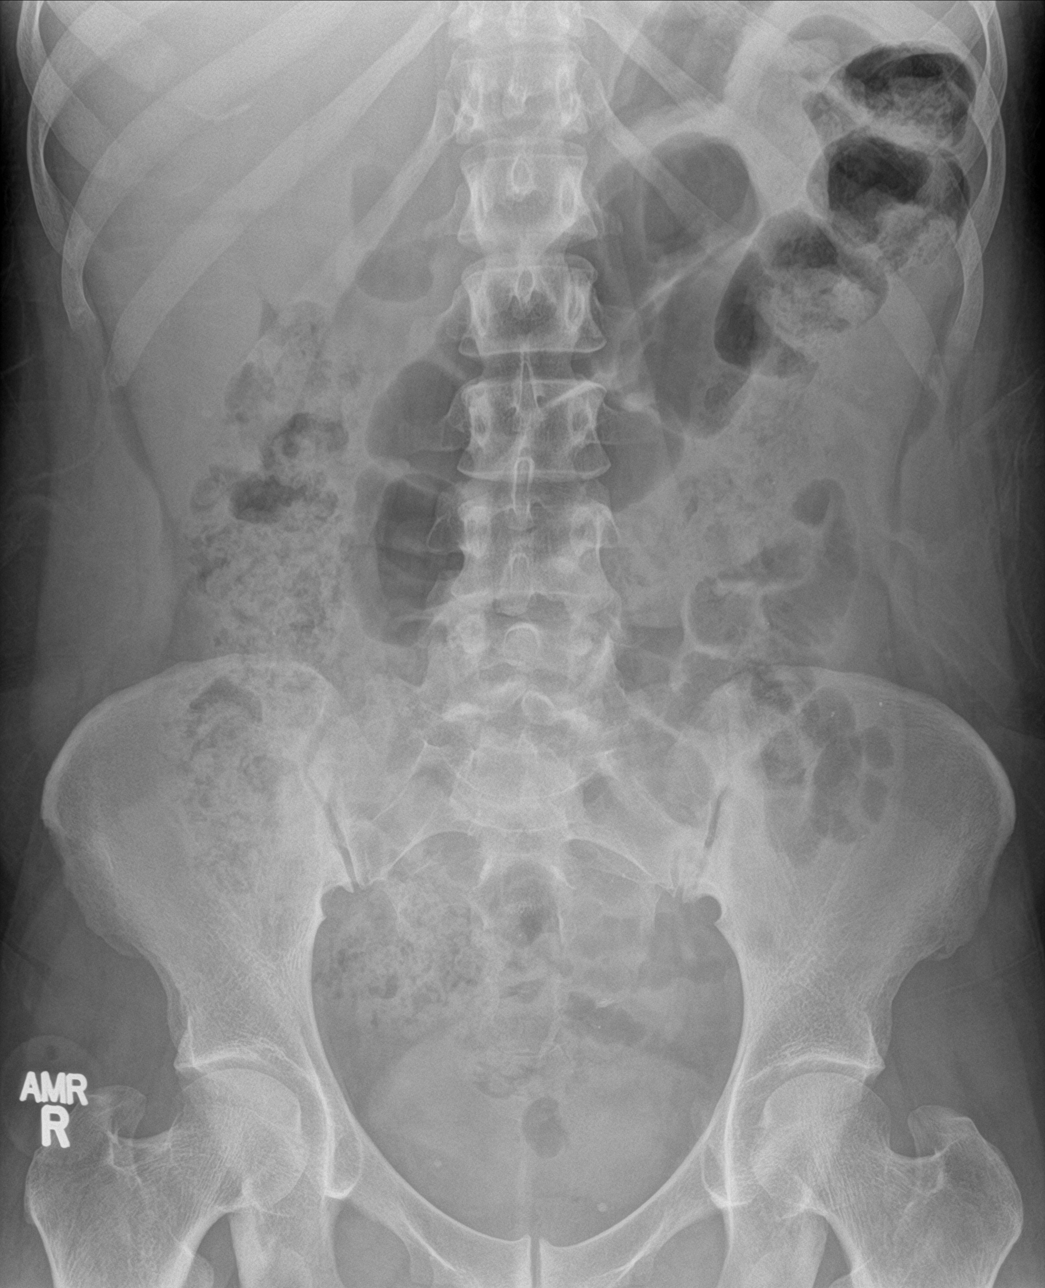

[3 of 3 positions shown; findings below may reference images not displayed]

FINDINGS: There is no evidence of dilated bowel loops or free intraperitoneal
air. No radiopaque calculi or other significant radiographic
abnormality is seen. Heart size and mediastinal contours are within
normal limits. Both lungs are clear.
IMPRESSION: Negative abdominal radiographs.  No acute cardiopulmonary disease.
